# Patient Record
Sex: Male | Born: 1950 | Race: White | Hispanic: No | Marital: Married
Health system: Midwestern US, Community
[De-identification: ages and names within clinical notes are randomized; demographics above are authoritative.]

## PROBLEM LIST (undated history)

## (undated) DIAGNOSIS — E7800 Pure hypercholesterolemia, unspecified: Secondary | ICD-10-CM

## (undated) DIAGNOSIS — I10 Essential (primary) hypertension: Secondary | ICD-10-CM

## (undated) DIAGNOSIS — G5602 Carpal tunnel syndrome, left upper limb: Secondary | ICD-10-CM

## (undated) DIAGNOSIS — M75112 Incomplete rotator cuff tear or rupture of left shoulder, not specified as traumatic: Secondary | ICD-10-CM

## (undated) DIAGNOSIS — M1711 Unilateral primary osteoarthritis, right knee: Principal | ICD-10-CM

## (undated) DIAGNOSIS — M7542 Impingement syndrome of left shoulder: Secondary | ICD-10-CM

## (undated) HISTORY — PX: HERNIA REPAIR: SHX51

---

## 2011-05-02 NOTE — Anesthesia Post-Procedure Evaluation (Signed)
Formatting of this note might be different from the original.  Post Anesthesia Note    [name]  is recovering from his anesthesia.     His most recent vital signs are: Temp: 36.2 C (97.2 F), Heart Rate: 73 , Pulse: 48 , Resp: 16 , BP: 139/82 mmHg, BP Mean: 101 MM HG, SpO2: 99 %     His airway is patent.  He  is awake and  can follow commands after his anesthetic.  His pain is adequately controlled.  His  vital signs indicate adequate postoperative hydration.  PONV is not clinically significant.    Jodene NamElizabeth R Foxx, MD M.D.   Electronically signed by Consuello BossierFoxx, Elizabeth C at 05/02/2011  8:05 PM EST

## 2011-05-02 NOTE — Anesthesia Pre-Procedure Evaluation (Signed)
Formatting of this note is different from the original.  05/02/2011     Preanesthesia  Consult     [name] is a 60 y.o. male.     Procedure Summary    Date/Time: 05/02/11 1020    Procedure: LAPAROSCOPIC BILATERAL INQUINAL HERNIA REPAIR (Bilateral Inguinal)    Anesthesia Type: General Anesthesia    Diagnosis: Bilat ing hernia [550.92]    Freetext Diagnosis: Bilateral Inguinal Hernia    Surgeon: Harrison Mons, MD       Plan:  GA (includes TIVA)    ASA Physical Status Classification:  PS-2 Mild systemic disease that results in no functional limitations.    Airway:  Mallampati I.    Was the patient on a beta-blocker prior to the admission? No.    Anesthesia Pre-Operative Note    Personal and Family Anesthesia History:  No anesthetic complications.    NPO Status:  Confirmed NPO.    Outpatient prescriptions marked as taking for the 05/02/11 encounter [facility] Encounter) with LILLY, MICHAEL C   Medication Sig Dispense Refill   ? naproxen (ALEVE) 220 mg PO TABS Take 220 mg by Mouth Take As Needed.       ? FLUoxetine (PROZAC) 20 mg PO CAPS Take 20 mg by Mouth Once a Day.         Allergies:  No Known Allergies    Past Medical History   Diagnosis Date   ? Sleep apnea      uses c-pap;study done 2009/[facility]   ? Chronic anxiety      previously treated with zoloft/now on prozac   ? Lung nodule      noted on CT 11/2010; repeatCT 03/2011---"NO CHANGE"   ? Elevated BP      on off in past; no meds   ? Other and unspecified disc disorder of unspecified region      Intervertebral disc disorders--lumbar hernated disc/has had ESI in past--source of pain on off   ? Diverticulosis of colon (without mention of hemorrhage)      Diverticulosis--11/2010 diverticulitis--TREATED WITH ANTIBIOTICS   ? Knee pain      bil    ? Cough 04/2011     took z-pak/cough is resolving     Past Surgical History   Procedure Date   ? Hx orthopedic surgery      x4--ACL twice on ea leg   ? Hx hernia repair      x2--right 1980 s; 1990's   ? Hx wisdom teeth  extraction late age 73's     Family History   Problem Relation Age of Onset   ? Hypertension Mother    ? Alzheimer's Mother    ? Cancer Sister      skin   ? Heart Failure Father      HA     History   Social History   ? Marital Status: Married     Spouse Name: N/A     Number of Children: N/A   ? Years of Education: N/A   Occupational History   ? Not on file.   Social History Main Topics   ? Smoking status: Former Smoker -- 1.0 packs/day for 10 years     Types: Cigarettes     Quit date: 05/06/1972   ? Smokeless tobacco: Never Used   ? Alcohol Use: Yes      occ   ? Drug Use: No   ? Sexually Active: Not on file   Other Topics Concern   ?  Blood Transfusions No   ? Exercise No     can climb stairs--no sob or c.p.   ? Special Diet No   ? Weight Concern Yes   Social History Narrative   ? No narrative on file     Review of Systems:  Review of systems per above medical, surgical and social history.    Dentition:  Normal and with left lower cracked molar.    Physical Exam:  BP 137/79  Pulse 69  Temp 36.7 C (98 F)  Resp 18  Ht 6\' 2"  (1.88 m)  Wt 115.667 kg (255 lb)  BMI 32.74 kg/m2  SpO2 97%  Heart:  Regular rate/rhythm.  Lungs:  Clear to auscultation.  Other physical exam comments:   EKG: No EKG.    CXR:  No CXR.    Laboratory Studies:   Lab Results   Component Value Date    NA 137 11/24/2010    NA 135* 11/24/2010    POTASSIUM 4.1 11/24/2010    POTASSIUM 5.2 11/24/2010    CREAT 0.7 11/24/2010    CREAT 0.6 11/24/2010    GLUCOSE 82 11/24/2010    GLUCOSE 85 11/24/2010    HCT 45.0 11/24/2010    HCT 45.2 11/24/2010    PLATELET 203 11/24/2010     Other Studies:    I have informed the patient or their guardian of the nature and purpose of the type of anesthesia, the reasonable alternative anesthesia methods, pertinent foreseeable risks involved and the possibility of complications.  I have explained that an alternative form of anesthesia may be required by unexpected conditions arising before or during the procedure.  Questions have  been answered to the satisfaction of the patient or their guardian who accepts the risk and agrees to proceed as planned.    Ronalee BeltsSatish P Adawadkar, MD      Electronically signed by Ronalee BeltsAdawadkar, Satish P, MD at 05/02/2011 10:49 AM EST

## 2015-02-03 LAB — PSA, DIAGNOSTIC (PROSTATE SPECIFIC AG): Prostate Specific Ag: 0.678 ng/ml (ref 0.0–4.1)

## 2015-09-01 LAB — TESTOSTERONE, TOTAL, ADULT MALE: Prostate Specific Ag: 566 ng/dl (ref 348–1197)

## 2016-06-21 NOTE — Anesthesia Pre-Procedure Evaluation (Signed)
Formatting of this note is different from the original.   Anesthesia Evaluation     Patient summary reviewed and Nursing notes reviewed    No history of anesthetic complications   Airway   Mallampati: II    Dental        Pulmonary - normal exam   breath sounds clear to auscultation  (+) sleep apnea on CPAP,   Cardiovascular   (+)   hypertension, well controlled    Rhythm: regular  Rate: normal    Neuro/Psych   (+) headaches,     GI/Hepatic/Renal     Comments: H/o polyps   Endo/Other           Anesthesia Plan    ASA 3     general     intravenous induction   Postoperative administration of opioids is intended.  Anesthetic plan and risks discussed with patient.  Use of blood products discussed with patient whom.   Plan discussed with CRNA.    QI NUMBER: [id number]    Electronically signed by Verlan Friends, MD at 06/21/2016 11:13 AM EST

## 2016-06-21 NOTE — Anesthesia Post-Procedure Evaluation (Signed)
Formatting of this note is different from the original.  Patient: Michael Galvan    Procedure(s):  COLONOSCOPY  //  Make 1130 case    Procedure Summary     Date Anesthesia Start Anesthesia Stop Room / Location    06/21/16 1120 1140 [facility] PROCEDURE RM 1 / [facility] OR      Procedure Diagnosis Surgeon Responsible Provider    COLONOSCOPY  //  Make 1130 case (N/A ) History of adenomatous polyp of colon  (History of adenomatous polyp of colon [Z86.010]) Nyra Capes, MD Verlan Friends, MD       Anesthesia Type: general  Last vitals  BP   125/80 (06/21/16 1157)    Temp     Pulse   62 (06/21/16 1158)   Resp   (!) 14 (06/21/16 1158)    SpO2   96 % (06/21/16 1158)      Patient location during evaluation: PACU  Patient participation: complete - patient participated  Level of consciousness: awake and alert  Pain score: 1  Pain management: satisfactory to patient  Airway patency: patent  no PONV   Other Anesthetic complications: no  Cardiovascular status: hemodynamically stable  Respiratory status: room air  Hydration status: euvolemic  Regional Anesthetic used: No      Electronically signed by Verlan Friends, MD at 06/21/2016 12:06 PM EST

## 2016-09-27 LAB — PSA, DIAGNOSTIC (PROSTATE SPECIFIC AG): Prostate Specific Ag: 0.991 ng/ml (ref 0.0–4.1)

## 2018-06-05 LAB — HEMOGLOBIN A1C
Estimated Avg Glucose, External: 137 mg/dL — ABNORMAL HIGH (ref 91–123)
Hemoglobin A1C, External: 6.4 % — ABNORMAL HIGH (ref 4.8–5.6)

## 2019-03-29 NOTE — Progress Notes (Signed)
Formatting of this note might be different from the original.  Negative COVID19 result received.  Patient notified via informed in ED   Electronically signed by Ceasar Lund, MA at 03/29/2019 12:42 PM EST

## 2019-09-21 ENCOUNTER — Ambulatory Visit
Admission: EM | Admit: 2019-09-21 | Discharge: 2019-09-21 | Disposition: A | Discharge status: Home / Self Care | Payer: Medicare Other

## 2019-09-21 DIAGNOSIS — J069 Acute upper respiratory infection, unspecified: Secondary | ICD-10-CM | POA: Diagnosis not present

## 2019-09-21 HISTORY — DX: Pure hypercholesterolemia, unspecified: E78.00

## 2019-09-21 HISTORY — DX: Essential (primary) hypertension: I10

## 2019-09-21 MED ORDER — CETIRIZINE HCL 10 MG PO TABS
10.0000 mg | ORAL_TABLET | Freq: Every day | ORAL | 0 refills | Status: DC
Start: 2019-09-21 — End: 2019-09-21

## 2019-09-21 MED ORDER — ALBUTEROL SULFATE HFA 108 (90 BASE) MCG/ACT IN AERS: 2.0000 | INHALATION_SPRAY | RESPIRATORY_TRACT | 0 refills | Status: AC | PRN

## 2019-09-21 MED ORDER — BENZONATATE 100 MG PO CAPS
100.0000 mg | ORAL_CAPSULE | Freq: Three times a day (TID) | ORAL | 0 refills | Status: DC
Start: 2019-09-21 — End: 2019-09-21

## 2019-09-21 MED ORDER — CETIRIZINE HCL 10 MG PO TABS
10.0000 mg | ORAL_TABLET | Freq: Every day | ORAL | 0 refills | Status: DC
Start: 2019-09-21 — End: 2019-12-17

## 2019-09-21 MED ORDER — PREDNISONE 20 MG PO TABS
20.0000 mg | ORAL_TABLET | Freq: Every day | ORAL | 0 refills | Status: DC
Start: 2019-09-21 — End: 2019-09-21

## 2019-09-21 MED ORDER — AEROCHAMBER PLUS FLO-VU MEDIUM MISC
1.0000 | Freq: Once | 0 refills | Status: AC
Start: 2019-09-21 — End: 2019-09-21

## 2019-09-21 MED ORDER — FLUTICASONE PROPIONATE 50 MCG/ACT NA SUSP
1.0000 | Freq: Every day | NASAL | 0 refills | Status: DC
Start: 2019-09-21 — End: 2019-09-21

## 2019-09-21 MED ORDER — ALBUTEROL SULFATE HFA 108 (90 BASE) MCG/ACT IN AERS
2.0000 | INHALATION_SPRAY | RESPIRATORY_TRACT | 0 refills | Status: DC | PRN
Start: 2019-09-21 — End: 2019-09-21

## 2019-09-21 MED ORDER — BENZONATATE 100 MG PO CAPS
100.0000 mg | ORAL_CAPSULE | Freq: Three times a day (TID) | ORAL | 0 refills | Status: DC
Start: 2019-09-21 — End: 2019-12-17

## 2019-09-21 MED ORDER — AEROCHAMBER PLUS FLO-VU MEDIUM MISC
1.0000 | Freq: Once | 0 refills | Status: DC
Start: 2019-09-21 — End: 2019-09-21

## 2019-09-21 MED ORDER — PREDNISONE 20 MG PO TABS
20.0000 mg | ORAL_TABLET | Freq: Every day | ORAL | 0 refills | Status: DC
Start: 2019-09-21 — End: 2019-12-17

## 2019-09-21 MED ORDER — FLUTICASONE PROPIONATE 50 MCG/ACT NA SUSP
1.0000 | Freq: Every day | NASAL | 0 refills | Status: AC
Start: 2019-09-21 — End: ?

## 2019-09-21 NOTE — ED Triage Notes (Signed)
Pt c/o sore throat, cough, nasal congestion, and chest congestion. State worse at night. Pt states has had both covid vaccine back in march.

## 2019-09-21 NOTE — ED Provider Notes (Signed)
[facility]    CSN: [id number] Arrival date & time: 09/21/19  1715      History   Chief Complaint Chief Complaint  Patient presents with  . Cough    HPI Donald Richard is a 69 y.o. male.  With history of hypertension presenting for sore throat, cough, nasal congestion since Sunday.  Denies known sick contacts, fever, arthralgias, myalgias.  Reports good oral intake without change in bowel or bladder habit.  Received second dose of Pfizer Covid vaccine end of March.  Has used OTC medications without relief.   Past Medical History:  Diagnosis Date  . High cholesterol   . Hypertension     There are no problems to display for this patient.   Past Surgical History:  Procedure Laterality Date  . HERNIA REPAIR         Home Medications    Prior to Admission medications   Medication Sig Start Date End Date Taking? Authorizing Provider  amphetamine-dextroamphetamine (ADDERALL) 5 MG tablet Take 5 mg by mouth daily.   Yes [provider]  atorvastatin (LIPITOR) 20 MG tablet Take 20 mg by mouth daily.   Yes [provider]  FLUoxetine HCl (PROZAC PO) Take by mouth.   Yes [provider]  irbesartan (AVAPRO) 150 MG tablet Take 150 mg by mouth daily.   Yes [provider]  albuterol (VENTOLIN HFA) 108 (90 Base) MCG/ACT inhaler Inhale 2 puffs into the lungs every 4 (four) hours as needed for wheezing or shortness of breath. 09/21/19   Hall-Potvin, [place], PA-C  benzonatate (TESSALON) 100 MG capsule Take 1 capsule (100 mg total) by mouth every 8 (eight) hours. 09/21/19   Hall-Potvin, [place], PA-C  cetirizine (ZYRTEC ALLERGY) 10 MG tablet Take 1 tablet (10 mg total) by mouth daily. 09/21/19   Hall-Potvin, [place], PA-C  fluticasone (FLONASE) 50 MCG/ACT nasal spray Place 1 spray into both nostrils daily. 09/21/19   Hall-Potvin, [place], PA-C  predniSONE (DELTASONE) 20 MG tablet Take 1 tablet (20 mg total) by mouth daily. 09/21/19    Hall-Potvin, [place], PA-C    Family History History reviewed. No pertinent family history.  Social History Social History   Tobacco Use  . Smoking status: Never Smoker  . Smokeless tobacco: Never Used  Substance Use Topics  . Alcohol use: Yes    Comment: social  . Drug use: Never     Allergies   Patient has no known allergies.   Review of Systems As per HPI   Physical Exam Triage Vital Signs ED Triage Vitals  Enc Vitals Group     BP 09/21/19 1735 129/80     Pulse Rate 09/21/19 1735 90     Resp 09/21/19 1735 18     Temp 09/21/19 1735 98.9 F (37.2 C)     Temp Source 09/21/19 1735 Oral     SpO2 09/21/19 1735 95 %     Weight --      Height --      Head Circumference --      Peak Flow --      Pain Score 09/21/19 1742 0     Pain Loc --      Pain Edu? --      Excl. in [place]? --    No data found.  Updated Vital Signs BP 129/80 (BP Location: Left Arm)   Pulse 90   Temp 98.9 F (37.2 C) (Oral)   Resp 18   SpO2 95%   Visual Acuity Right Eye  Distance:   Left Eye Distance:   Bilateral Distance:    Right Eye Near:   Left Eye Near:    Bilateral Near:     Physical Exam Constitutional:      General: He is not in acute distress.    Appearance: He is not toxic-appearing or diaphoretic.  HENT:     Head: Normocephalic and atraumatic.     Mouth/Throat:     Mouth: Mucous membranes are moist.     Pharynx: Oropharynx is clear.  Eyes:     General: No scleral icterus.    Conjunctiva/sclera: Conjunctivae normal.     Pupils: Pupils are equal, round, and reactive to light.  Neck:     Comments: Trachea midline, negative JVD Cardiovascular:     Rate and Rhythm: Normal rate and regular rhythm.  Pulmonary:     Effort: Pulmonary effort is normal. No respiratory distress.     Breath sounds: No wheezing.  Musculoskeletal:     Cervical back: Neck supple. No tenderness.  Lymphadenopathy:     Cervical: No cervical adenopathy.  Skin:    Capillary Refill: Capillary  refill takes less than 2 seconds.     Coloration: Skin is not jaundiced or pale.     Findings: No rash.  Neurological:     Mental Status: He is alert and oriented to person, place, and time.      UC Treatments / Results  Labs (all labs ordered are listed, but only abnormal results are displayed) Labs Reviewed  NOVEL CORONAVIRUS, NAA    EKG   Radiology No results found.  Procedures Procedures (including critical care time)  Medications Ordered in UC Medications - No data to display  Initial Impression / Assessment and Plan / UC Course  I have reviewed the triage vital signs and the nursing notes.  Pertinent labs & imaging results that were available during my care of the patient were reviewed by me and considered in my medical decision making (see chart for details).     Patient afebrile, nontoxic, with SpO2 95%.  Covid PCR pending.  Patient to quarantine until results are back.  We will treat supportively as outlined below.  Return precautions discussed, patient verbalized understanding and is agreeable to plan. Final Clinical Impressions(s) / UC Diagnoses   Final diagnoses:  URI with cough and congestion     Discharge Instructions     Your COVID test is pending - it is important to quarantine / isolate at home until your results are back. If you test positive and would like further evaluation for persistent or worsening symptoms, you may schedule an E-visit or virtual (video) visit throughout the [facility] app or website.  PLEASE NOTE: If you develop severe chest pain or shortness of breath please go to the ER or call 9-1-1 for further evaluation --> DO NOT schedule electronic or virtual visits for this. Please call our office for further guidance / recommendations as needed.  For information about the Covid vaccine, please visit [email or web address]    ED Prescriptions    Medication Sig Dispense Auth. Provider   benzonatate (TESSALON) 100 MG  capsule  (Status: Discontinued) Take 1 capsule (100 mg total) by mouth every 8 (eight) hours. 21 capsule Hall-Potvin, [place], PA-C   predniSONE (DELTASONE) 20 MG tablet  (Status: Discontinued) Take 1 tablet (20 mg total) by mouth daily. 5 tablet Hall-Potvin, [place], PA-C   fluticasone (FLONASE) 50 MCG/ACT nasal spray  (Status: Discontinued) Place 1 spray into both nostrils daily.  16 g Hall-Potvin, [place], PA-C   albuterol (VENTOLIN HFA) 108 (90 Base) MCG/ACT inhaler  (Status: Discontinued) Inhale 2 puffs into the lungs every 4 (four) hours as needed for wheezing or shortness of breath. 18 g Hall-Potvin, [place], PA-C   Spacer/Aero-Holding Chambers (AEROCHAMBER PLUS FLO-VU MEDIUM) MISC  (Status: Discontinued) 1 each by Other route once for 1 dose. 1 each Hall-Potvin, [place], PA-C   cetirizine (ZYRTEC ALLERGY) 10 MG tablet  (Status: Discontinued) Take 1 tablet (10 mg total) by mouth daily. 30 tablet Hall-Potvin, [place], PA-C   albuterol (VENTOLIN HFA) 108 (90 Base) MCG/ACT inhaler Inhale 2 puffs into the lungs every 4 (four) hours as needed for wheezing or shortness of breath. 18 g Hall-Potvin, [place], PA-C   benzonatate (TESSALON) 100 MG capsule Take 1 capsule (100 mg total) by mouth every 8 (eight) hours. 21 capsule Hall-Potvin, [place], PA-C   cetirizine (ZYRTEC ALLERGY) 10 MG tablet Take 1 tablet (10 mg total) by mouth daily. 30 tablet Hall-Potvin, [place], PA-C   fluticasone (FLONASE) 50 MCG/ACT nasal spray Place 1 spray into both nostrils daily. 16 g Hall-Potvin, [place], PA-C   predniSONE (DELTASONE) 20 MG tablet Take 1 tablet (20 mg total) by mouth daily. 5 tablet Hall-Potvin, [place], PA-C   Spacer/Aero-Holding Chambers (AEROCHAMBER PLUS FLO-VU MEDIUM) MISC 1 each by Other route once for 1 dose. 1 each Hall-Potvin, [place], PA-C     PDMP not reviewed this encounter.   Hall-Potvin, [place], [place] 09/22/19 1233

## 2019-09-21 NOTE — Discharge Instructions (Signed)
Your COVID test is pending - it is important to quarantine / isolate at home until your results are back. °If you test positive and would like further evaluation for persistent or worsening symptoms, you may schedule an E-visit or virtual (video) visit throughout the [facility] app or website. ° °PLEASE NOTE: If you develop severe chest pain or shortness of breath please go to the ER or call 9-1-1 for further evaluation --> DO NOT schedule electronic or virtual visits for this. °Please call our office for further guidance / recommendations as needed. ° °For information about the Covid vaccine, please visit [email or web address] °

## 2019-09-22 ENCOUNTER — Encounter: Payer: Self-pay | Admitting: Emergency Medicine

## 2019-09-23 LAB — NOVEL CORONAVIRUS, NAA: SARS-CoV-2, NAA: NOT DETECTED

## 2019-09-23 LAB — SARS-COV-2, NAA 2 DAY TAT

## 2019-12-17 ENCOUNTER — Other Ambulatory Visit: Payer: Self-pay

## 2019-12-17 ENCOUNTER — Ambulatory Visit
Admission: EM | Admit: 2019-12-17 | Discharge: 2019-12-17 | Disposition: A | Discharge status: Home / Self Care | Payer: Medicare Other

## 2019-12-17 DIAGNOSIS — J069 Acute upper respiratory infection, unspecified: Secondary | ICD-10-CM | POA: Diagnosis not present

## 2019-12-17 DIAGNOSIS — R059 Cough, unspecified: Secondary | ICD-10-CM

## 2019-12-17 DIAGNOSIS — R05 Cough: Secondary | ICD-10-CM

## 2019-12-17 MED ORDER — BENZONATATE 100 MG PO CAPS
100.0000 mg | ORAL_CAPSULE | Freq: Three times a day (TID) | ORAL | 0 refills | Status: DC
Start: 2019-12-17 — End: 2021-04-17

## 2019-12-17 MED ORDER — AEROCHAMBER PLUS FLO-VU MEDIUM MISC
1.0000 | Freq: Once | 0 refills | Status: AC
Start: 2019-12-17 — End: 2019-12-17

## 2019-12-17 MED ORDER — CETIRIZINE HCL 10 MG PO TABS
10.0000 mg | ORAL_TABLET | Freq: Every day | ORAL | 0 refills | Status: DC
Start: 2019-12-17 — End: 2021-04-17

## 2019-12-17 NOTE — ED Triage Notes (Signed)
Pt c/o cough, congestion and chest soreness x 3 days. Requesting COVID testing. Fully vaccinated over 1 month ago

## 2019-12-17 NOTE — ED Provider Notes (Signed)
[facility]    CSN: [id number] Arrival date & time: 12/17/19  1035      History   Chief Complaint Chief Complaint  Patient presents with  . Cough  . Nasal Congestion    HPI Donald Richard is a 69 y.o. male presenting for URI symptoms x3 days.  Patient birth history: Endorsing mildly productive cough without chest pain, palpitations, shortness of breath.  Endorsing nasal congestion without sinus pressure.  Requesting Covid testing: Fully vaccinated, no known sick contacts.  No fever, vomiting, and is able to have adequate oral intake.    Past Medical History:  Diagnosis Date  . High cholesterol   . Hypertension     There are no problems to display for this patient.   Past Surgical History:  Procedure Laterality Date  . HERNIA REPAIR         Home Medications    Prior to Admission medications   Medication Sig Start Date End Date Taking? Authorizing Provider  Ascorbic Acid (VITAMIN C PO) Take by mouth.   Yes [provider]  Multiple Vitamins-Minerals (ZINC PO) Take by mouth.   Yes [provider]  albuterol (VENTOLIN HFA) 108 (90 Base) MCG/ACT inhaler Inhale 2 puffs into the lungs every 4 (four) hours as needed for wheezing or shortness of breath. 09/21/19   Hall-Potvin, [place], PA-C  amphetamine-dextroamphetamine (ADDERALL) 5 MG tablet Take 5 mg by mouth daily.    [provider]  atorvastatin (LIPITOR) 20 MG tablet Take 20 mg by mouth daily.    [provider]  benzonatate (TESSALON) 100 MG capsule Take 1 capsule (100 mg total) by mouth every 8 (eight) hours. 12/17/19   Hall-Potvin, [place], PA-C  cetirizine (ZYRTEC ALLERGY) 10 MG tablet Take 1 tablet (10 mg total) by mouth daily. 12/17/19   Hall-Potvin, [place], PA-C  FLUoxetine HCl (PROZAC PO) Take by mouth.    [provider]  fluticasone (FLONASE) 50 MCG/ACT nasal spray Place 1 spray into both nostrils daily. 09/21/19   Hall-Potvin, [place], PA-C    irbesartan (AVAPRO) 150 MG tablet Take 150 mg by mouth daily.    [provider]  Spacer/Aero-Holding Chambers (AEROCHAMBER PLUS FLO-VU MEDIUM) MISC 1 each by Other route once for 1 dose. 12/17/19 12/17/19  Hall-Potvin, [place], PA-C    Family History History reviewed. No pertinent family history.  Social History Social History   Tobacco Use  . Smoking status: Never Smoker  . Smokeless tobacco: Never Used  Substance Use Topics  . Alcohol use: Yes    Comment: social  . Drug use: Never     Allergies   Patient has no known allergies.   Review of Systems As per HPI   Physical Exam Triage Vital Signs ED Triage Vitals [12/17/19 1041]  Enc Vitals Group     BP 138/85     Pulse Rate 82     Resp 16     Temp 98.2 F (36.8 C)     Temp src      SpO2 95 %     Weight      Height      Head Circumference      Peak Flow      Pain Score 0     Pain Loc      Pain Edu?      Excl. in [place]?    No data found.  Updated Vital Signs BP 138/85   Pulse 82   Temp 98.2 F (36.8 C)   Resp  16   SpO2 95%   Visual Acuity Right Eye Distance:   Left Eye Distance:   Bilateral Distance:    Right Eye Near:   Left Eye Near:    Bilateral Near:     Physical Exam Constitutional:      General: He is not in acute distress.    Appearance: He is not toxic-appearing or diaphoretic.  HENT:     Head: Normocephalic and atraumatic.     Right Ear: Tympanic membrane, ear canal and external ear normal.     Left Ear: Tympanic membrane, ear canal and external ear normal.     Nose: Nose normal.     Mouth/Throat:     Mouth: Mucous membranes are moist.     Pharynx: Oropharynx is clear. No oropharyngeal exudate or posterior oropharyngeal erythema.  Eyes:     General: No scleral icterus.    Conjunctiva/sclera: Conjunctivae normal.     Pupils: Pupils are equal, round, and reactive to light.  Neck:     Comments: Trachea midline, negative JVD Cardiovascular:     Rate and Rhythm: Normal  rate and regular rhythm.  Pulmonary:     Effort: Pulmonary effort is normal. No respiratory distress.     Breath sounds: No wheezing.  Musculoskeletal:     Cervical back: Neck supple. No tenderness.  Lymphadenopathy:     Cervical: No cervical adenopathy.  Skin:    Capillary Refill: Capillary refill takes less than 2 seconds.     Coloration: Skin is not jaundiced or pale.     Findings: No rash.  Neurological:     Mental Status: He is alert and oriented to person, place, and time.      UC Treatments / Results  Labs (all labs ordered are listed, but only abnormal results are displayed) Labs Reviewed  NOVEL CORONAVIRUS, NAA    EKG   Radiology No results found.  Procedures Procedures (including critical care time)  Medications Ordered in UC Medications - No data to display  Initial Impression / Assessment and Plan / UC Course  I have reviewed the triage vital signs and the nursing notes.  Pertinent labs & imaging results that were available during my care of the patient were reviewed by me and considered in my medical decision making (see chart for details).     Patient afebrile, nontoxic, with SpO2 95%.  Covid PCR pending.  Patient to quarantine until results are back.  We will treat supportively as outlined below.  Return precautions discussed, patient verbalized understanding and is agreeable to plan. Final Clinical Impressions(s) / UC Diagnoses   Final diagnoses:  Cough  URI with cough and congestion     Discharge Instructions     Tessalon for cough. Start flonase, atrovent nasal spray for nasal congestion/drainage. You can use over the counter nasal saline rinse such as neti pot for nasal congestion. Keep hydrated, your urine should be clear to pale yellow in color. Tylenol/motrin for fever and pain. Monitor for any worsening of symptoms, chest pain, shortness of breath, wheezing, swelling of the throat, go to the emergency department for further evaluation  needed.     ED Prescriptions    Medication Sig Dispense Auth. Provider   cetirizine (ZYRTEC ALLERGY) 10 MG tablet Take 1 tablet (10 mg total) by mouth daily. 30 tablet Hall-Potvin, [place], PA-C   Spacer/Aero-Holding Chambers (AEROCHAMBER PLUS FLO-VU MEDIUM) MISC 1 each by Other route once for 1 dose. 1 each Hall-Potvin, [place], PA-C   benzonatate (TESSALON) 100  MG capsule Take 1 capsule (100 mg total) by mouth every 8 (eight) hours. 21 capsule Hall-Potvin, [place], PA-C     PDMP not reviewed this encounter.   Hall-Potvin, [place], [place] 12/17/19 1146

## 2019-12-17 NOTE — Discharge Instructions (Signed)

## 2019-12-18 LAB — NOVEL CORONAVIRUS, NAA: SARS-CoV-2, NAA: NOT DETECTED

## 2019-12-18 LAB — SARS-COV-2, NAA 2 DAY TAT

## 2020-02-25 ENCOUNTER — Ambulatory Visit (HOSPITAL_COMMUNITY)
Admission: EM | Admit: 2020-02-25 | Discharge: 2020-02-25 | Disposition: A | Discharge status: Home / Self Care | Payer: Medicare Other | Attending: Urgent Care | Admitting: Urgent Care

## 2020-02-25 ENCOUNTER — Encounter (HOSPITAL_COMMUNITY): Payer: Self-pay

## 2020-02-25 ENCOUNTER — Other Ambulatory Visit: Payer: Self-pay

## 2020-02-25 DIAGNOSIS — R5383 Other fatigue: Secondary | ICD-10-CM | POA: Insufficient documentation

## 2020-02-25 DIAGNOSIS — N3001 Acute cystitis with hematuria: Secondary | ICD-10-CM

## 2020-02-25 DIAGNOSIS — R52 Pain, unspecified: Secondary | ICD-10-CM

## 2020-02-25 DIAGNOSIS — Z79899 Other long term (current) drug therapy: Secondary | ICD-10-CM | POA: Diagnosis not present

## 2020-02-25 DIAGNOSIS — M791 Myalgia, unspecified site: Secondary | ICD-10-CM

## 2020-02-25 DIAGNOSIS — Z20822 Contact with and (suspected) exposure to covid-19: Secondary | ICD-10-CM | POA: Diagnosis not present

## 2020-02-25 DIAGNOSIS — R509 Fever, unspecified: Secondary | ICD-10-CM | POA: Diagnosis not present

## 2020-02-25 DIAGNOSIS — R5381 Other malaise: Secondary | ICD-10-CM | POA: Diagnosis present

## 2020-02-25 LAB — POCT URINALYSIS DIPSTICK, ED / UC
Bilirubin Urine: NEGATIVE
Glucose, UA: NEGATIVE mg/dL
Ketones, ur: NEGATIVE mg/dL
Nitrite: POSITIVE — AB
Protein, ur: 30 mg/dL — AB
Specific Gravity, Urine: 1.02 (ref 1.005–1.030)
Urobilinogen, UA: 1 mg/dL (ref 0.0–1.0)
pH: 6 (ref 5.0–8.0)

## 2020-02-25 LAB — RESP PANEL BY RT PCR (RSV, FLU A&B, COVID)
Influenza A by PCR: NEGATIVE
Influenza B by PCR: NEGATIVE
Respiratory Syncytial Virus by PCR: NEGATIVE
SARS Coronavirus 2 by RT PCR: NEGATIVE

## 2020-02-25 MED ORDER — SULFAMETHOXAZOLE-TRIMETHOPRIM 800-160 MG PO TABS
1.0000 | ORAL_TABLET | Freq: Two times a day (BID) | ORAL | 0 refills | Status: AC
Start: 2020-02-25 — End: ?

## 2020-02-25 NOTE — ED Provider Notes (Signed)
[facility]   MRN: [id number] DOB: 1950/05/11  Subjective:   Donald Richard is a 69 y.o. male presenting for 1 day history of acute onset malaise.  Patient has had COVID vaccination.  ROS as below.  Would also like to have his urine evaluated, states that he has had more urinary frequency, urgency.  States the last time he was checked for diabetes was last year in November, has not gone back for his annual checkup.  No current facility-administered medications for this encounter.  Current Outpatient Medications:    albuterol (VENTOLIN HFA) 108 (90 Base) MCG/ACT inhaler, Inhale 2 puffs into the lungs every 4 (four) hours as needed for wheezing or shortness of breath., Disp: 18 g, Rfl: 0   amphetamine-dextroamphetamine (ADDERALL) 5 MG tablet, Take 5 mg by mouth daily., Disp: , Rfl:    Ascorbic Acid (VITAMIN C PO), Take by mouth., Disp: , Rfl:    atorvastatin (LIPITOR) 20 MG tablet, Take 20 mg by mouth daily., Disp: , Rfl:    benzonatate (TESSALON) 100 MG capsule, Take 1 capsule (100 mg total) by mouth every 8 (eight) hours., Disp: 21 capsule, Rfl: 0   cetirizine (ZYRTEC ALLERGY) 10 MG tablet, Take 1 tablet (10 mg total) by mouth daily., Disp: 30 tablet, Rfl: 0   FLUoxetine HCl (PROZAC PO), Take by mouth., Disp: , Rfl:    fluticasone (FLONASE) 50 MCG/ACT nasal spray, Place 1 spray into both nostrils daily., Disp: 16 g, Rfl: 0   irbesartan (AVAPRO) 150 MG tablet, Take 150 mg by mouth daily., Disp: , Rfl:    Multiple Vitamins-Minerals (ZINC PO), Take by mouth., Disp: , Rfl:    No Known Allergies  Past Medical History:  Diagnosis Date   High cholesterol    Hypertension      Past Surgical History:  Procedure Laterality Date   HERNIA REPAIR      History reviewed. No pertinent family history.  Social History   Tobacco Use   Smoking status: Never Smoker   Smokeless tobacco: Never Used  Substance Use Topics   Alcohol use: Yes    Comment: social    Drug use: Never    Review of Systems  Constitutional: Positive for fever and malaise/fatigue.  HENT: Negative for congestion, ear pain, sinus pain and sore throat.   Eyes: Negative for discharge and redness.  Respiratory: Positive for shortness of breath. Negative for cough, hemoptysis and wheezing.   Cardiovascular: Negative for chest pain.  Gastrointestinal: Negative for abdominal pain, blood in stool, constipation, diarrhea, nausea and vomiting.  Genitourinary: Positive for frequency. Negative for dysuria, flank pain, hematuria and urgency.  Musculoskeletal: Positive for myalgias.  Skin: Negative for rash.  Neurological: Positive for headaches. Negative for dizziness and weakness.  Psychiatric/Behavioral: Negative for depression and substance abuse.    Objective:   Vitals: BP 130/75 (BP Location: Right Arm)    Pulse 88    Temp 98.6 F (37 C) (Oral)    Resp 18    SpO2 100%   Physical Exam Constitutional:      General: He is not in acute distress.    Appearance: Normal appearance. He is well-developed. He is not ill-appearing, toxic-appearing or diaphoretic.  HENT:     Head: Normocephalic and atraumatic.     Right Ear: External ear normal.     Left Ear: External ear normal.     Nose: Nose normal.     Mouth/Throat:     Mouth: Mucous membranes are moist.  Pharynx: Oropharynx is clear.  Eyes:     General: No scleral icterus.       Right eye: No discharge.        Left eye: No discharge.     Extraocular Movements: Extraocular movements intact.     Conjunctiva/sclera: Conjunctivae normal.     Pupils: Pupils are equal, round, and reactive to light.  Cardiovascular:     Rate and Rhythm: Normal rate and regular rhythm.     Heart sounds: Normal heart sounds. No murmur heard.  No friction rub. No gallop.   Pulmonary:     Effort: Pulmonary effort is normal. No respiratory distress.     Breath sounds: Normal breath sounds. No stridor. No wheezing, rhonchi or rales.  Abdominal:      Tenderness: There is no right CVA tenderness or left CVA tenderness.  Neurological:     Mental Status: He is alert and oriented to person, place, and time.  Psychiatric:        Mood and Affect: Mood normal.        Behavior: Behavior normal.        Thought Content: Thought content normal.        Judgment: Judgment normal.     Results for orders placed or performed during the hospital encounter of 02/25/20 (from the past 24 hour(s))  POC Urinalysis dipstick     Status: Abnormal   Collection Time: 02/25/20  3:48 PM  Result Value Ref Range   Glucose, UA NEGATIVE NEGATIVE mg/dL   Bilirubin Urine NEGATIVE NEGATIVE   Ketones, ur NEGATIVE NEGATIVE mg/dL   Specific Gravity, Urine 1.020 1.005 - 1.030   Hgb urine dipstick MODERATE (A) NEGATIVE   pH 6.0 5.0 - 8.0   Protein, ur 30 (A) NEGATIVE mg/dL   Urobilinogen, UA 1.0 0.0 - 1.0 mg/dL   Nitrite POSITIVE (A) NEGATIVE   Leukocytes,Ua SMALL (A) NEGATIVE     Assessment and Plan :   PDMP not reviewed this encounter.  1. Acute cystitis with hematuria   2. Body aches   3. Malaise and fatigue     Respiratory panel pending.  Start Bactrim to cover for acute cystitis, urine culture pending.  Recommended aggressive hydration, limiting urinary irritants.  Emphasized need for close follow-up with PCP.  Counseled patient on potential for adverse effects with medications prescribed/recommended today, ER and return-to-clinic precautions discussed, patient verbalized understanding.    Wallis Bamberg, PA-C 02/25/20 1606

## 2020-02-25 NOTE — ED Triage Notes (Signed)
Pt present fever and body aches. Symptoms started yesterday. Pt states he has a slight cough but is not persistent

## 2020-02-28 LAB — URINE CULTURE: Culture: >=100000 — AB

## 2020-03-03 LAB — PSA PROSTATIC SPECIFIC ANTIGEN: PSA: 9 ng/mL — AB (ref 0.0–4.1)

## 2020-03-03 LAB — PSA, DIAGNOSTIC (PROSTATE SPECIFIC AG): Prostate Specific Ag: 9 ng/ml — AB (ref 0.0–4.1)

## 2020-03-04 LAB — HEMOGLOBIN A1C
Estimated Avg Glucose, External: 150 mg/dL — ABNORMAL HIGH (ref 91–123)
Hemoglobin A1C, External: 6.9 % — ABNORMAL HIGH (ref 4.8–5.6)

## 2020-03-17 ENCOUNTER — Ambulatory Visit: Attending: Family | Primary: Internal Medicine

## 2020-03-17 ENCOUNTER — Encounter: Attending: Family | Primary: Internal Medicine

## 2020-03-17 ENCOUNTER — Ambulatory Visit: Admit: 2020-03-17 | Discharge: 2020-03-17 | Attending: Family | Primary: Internal Medicine

## 2020-03-17 DIAGNOSIS — R972 Elevated prostate specific antigen [PSA]: Secondary | ICD-10-CM

## 2020-03-17 LAB — AMB POC URINALYSIS DIP STICK AUTO W/O MICRO
Bilirubin (UA POC): NEGATIVE
Bilirubin, Urine, POC: NEGATIVE
Blood (UA POC): NEGATIVE
Blood (UA POC): NEGATIVE
Glucose (UA POC): NEGATIVE
Glucose, Urine, POC: NEGATIVE
Ketones (UA POC): NEGATIVE
Ketones, Urine, POC: NEGATIVE
Leukocyte Esterase, Urine, POC: NEGATIVE
Leukocyte esterase (UA POC): NEGATIVE
Nitrite, Urine, POC: NEGATIVE
Nitrites (UA POC): NEGATIVE
Protein (UA POC): NEGATIVE
Protein, Urine, POC: NEGATIVE
Specific Gravity, Urine, POC: 1.03 NA (ref 1.001–1.035)
Specific gravity (UA POC): 1.03 (ref 1.001–1.035)
Urobilinogen (UA POC): 0.2 (ref 0.2–1)
Urobilinogen, POC: 0.2 (ref 0.2–1)
pH (UA POC): 5.5 (ref 4.6–8.0)
pH, Urine, POC: 5.5 NA (ref 4.6–8.0)

## 2020-03-17 LAB — AMB POC PVR, MEAS,POST-VOID RES,US,NON-IMAGING
PVR POC: 32 cc
PVR: 32 cc

## 2020-03-17 NOTE — Progress Notes (Signed)
Progress Notes by Monica Becton, FNP at 03/17/20 0920                Author: Monica Becton, FNP  Service: --  Author Type: Nurse Practitioner       Filed: 03/17/20 1253  Encounter Date: 03/17/2020  Status: Signed          Editor: Monica Becton, FNP (Nurse Practitioner)                    Michael Galvan   DOB November 07, 1950   Enc Date 03/17/2020        Chief Complaint       Patient presents with        ?  Elevated PSA           HPI:         Michael Galvan is a 69 y.o.  male who was referred by No primary care provider on file. for evaluation  of elevated PSA.       Patient established care on 03/17/2020. PSA was 9.00 ng/mL on 03/03/2020, previously was 0.991 ng/mL on 09/27/2016. No PSAs in 2019 or 2020 done.FH father w/bladder Ca. No FH of PCa. Non smoker. Only urinary c/o was UUI went he held his urine for too long.  No sx of infection.         PSAs:   12/2010-1.71   12/2011-1.1   12/2012-0.898   01/2015-0.678   09/2016-0.991   02/2020-9.00           Past Medical History:        Diagnosis  Date         ?  Anxiety       ?  Hypertension       ?  OSA (obstructive sleep apnea)       ?  Pure hypercholesterolemia       ?  Sleep apnea           ?  Type 2 diabetes mellitus (HCC)             No past surgical history on file.       No family history on file.      Social Hx: works at a [employer]      Allergies:   No Known Allergies         ROS:      Constitutional: Fever:    Skin: Rash:    HEENT: Hearing difficulty:    Eyes: Blurred vision:    Cardiovascular: Chest pain:    Respiratory: Shortness of breath:    Gastrointestinal: Nausea/vomiting:    Musculoskeletal: Back pain:    Neurological: Weakness:    Psychological: Memory loss:    Comments/additional findings:    All other systems reviewed and are negative            PE:      Visit Vitals      Temp  97.1 ??F (36.2 ??C)     Ht  6' (1.829 m)     Wt  257 lb (116.6 kg)        BMI  34.86 kg/m??        Constitutional: WDWN, Pleasant and appropriate affect, No  acute distress .     CV:  No peripheral swelling noted   Respiratory: No respiratory distress or difficulties   GI:  No abdominal masses or tenderness.    No hernias noted.    Skin:  No jaundice.     Neuro/Psych:  Alert and o riented x 3. Affect  appropriate.    Lymphatic:   No enlarged inguinal lymph nodes.             GU Male: 03/17/2020    Back:  No CVA tenderness.    Scrotum:  No scrotal rash or lesions noted.   Testes:  Normal bilateral testes.    Epididymides: Normal    Meatus: Normal, no discharge    Penis: Normal, no lesions or deformity    Prostate:  Prostate smooth, 2-3/4, asymmetrical L>R  , non-tender, without nodules, with nonpalpable  seminal vesicles.   Sphincter/Rectum: Normal tone, No masses    Anus/Perineum:  N ormal, no lesions.             LAB:        Results for orders placed or performed in visit on 03/17/20     AMB POC URINALYSIS DIP STICK AUTO W/O MICRO         Result  Value  Ref Range            Color (UA POC)  Yellow         Clarity (UA POC)  Clear         Glucose (UA POC)  Negative  Negative       Bilirubin (UA POC)  Negative  Negative       Ketones (UA POC)  Negative  Negative       Specific gravity (UA POC)  1.030  1.001 - 1.035       Blood (UA POC)  Negative  Negative       pH (UA POC)  5.5  4.6 - 8.0       Protein (UA POC)  Negative  Negative       Urobilinogen (UA POC)  0.2 mg/dL  0.2 - 1       Nitrites (UA POC)  Negative  Negative       Leukocyte esterase (UA POC)  Negative  Negative       AMB POC PVR, MEAS,POST-VOID RES,US,NON-IMAGING         Result  Value  Ref Range            PVR  32  cc                 Prostate Specific Ag          Date  Value  Ref Range  Status          03/03/2020  9.0 (A)  0.0 - 4.1 ng/ml  Final     09/27/2016  0.991  0.0 - 4.1 ng/ml  Final     09/01/2015  566  348 - 1,197 ng/dl  Final          16/02/9603  0.678  0.0 - 4.1 ng/ml  Final                  IMP:         Encounter Diagnosis        Name  Primary?         ?  Elevated PSA  Yes        SUMMARY STATEMENT  AND PLAN:      Elevated PSA       Possible lab error   We will recheck in 6 weeks   If value is still elevated above normal limits, patient would like to proceed with  MRI and prostate biopsy.      Return in 6 weeks for discussion      PLEASE NOTE:    This document has been produced using voice recognition software. Unrecognized errors in transcription may be present      Monica Becton, FNP

## 2020-04-22 LAB — PSA, TOTAL AND FREE
PCT FREE PSA,PSA3: 30 % (ref >25)
PSA, FREE,PSA1: 0.43 ng/mL
PSA, TOTAL: 1.43 ng/mL (ref <=4.100)

## 2020-04-22 LAB — PSA, TOTAL &  FREE
% FREE PSA: 30 % (ref >25)
PSA Total: 1.43 ng/mL (ref <=4.100)
PSA, FREE: 0.43 ng/mL

## 2020-04-25 ENCOUNTER — Ambulatory Visit: Attending: Family | Primary: Internal Medicine

## 2020-04-25 ENCOUNTER — Ambulatory Visit: Admit: 2020-04-25 | Discharge: 2020-04-25 | Attending: Family | Primary: Internal Medicine

## 2020-04-25 DIAGNOSIS — R972 Elevated prostate specific antigen [PSA]: Secondary | ICD-10-CM

## 2020-04-25 NOTE — Progress Notes (Signed)
Progress Notes by Monica Becton, FNP at 04/25/20 0940                Author: Monica Becton, FNP  Service: --  Author Type: Nurse Practitioner       Filed: 04/25/20 1005  Encounter Date: 04/25/2020  Status: Signed          Editor: Monica Becton, FNP (Nurse Practitioner)                    Michael Galvan   DOB 03/31/1951   Enc Date 04/25/2020        Chief Complaint       Patient presents with        ?  Elevated PSA           HPI:         Michael Galvan is a 69 y.o.  male who was referred by Evern Core, MD for evaluation of  elevated PSA.       Patient established care on 03/17/2020. PSA was 9.00 ng/mL on 03/03/2020, previously was 0.991 ng/mL on 09/27/2016. No PSAs in 2019 or 2020 done.FH father w/bladder Ca. No FH of PCa. Non smoker. Only urinary c/o was UUI went he held his urine for too long.  No sx of infection.         PSAs:   12/2010-1.71   12/2011-1.1   12/2012-0.898   01/2015-0.678   09/2016-0.991   02/2020-9.00   04/21/20- 1.43      Patient returned 04/25/2020 with a PSA of 1.43 this prior 9.0 PSA was  was most likely an outlier. Pt's normal PSA value is most likely somewhere around 1.5..         Past Medical History:        Diagnosis  Date         ?  Anxiety       ?  Hypertension       ?  OSA (obstructive sleep apnea)       ?  Pure hypercholesterolemia       ?  Sleep apnea           ?  Type 2 diabetes mellitus (HCC)               Past Surgical History:         Procedure  Laterality  Date          ?  HX HERNIA REPAIR         ?  HX HERNIA REPAIR         ?  HX ORTHOPAEDIC              ?  HX WISDOM TEETH EXTRACTION                  Family History         Problem  Relation  Age of Onset          ?  Alzheimer's Disease  Mother       ?  Hypertension  Mother       ?  Heart Failure  Father            ?  Cancer  Sister             Social Hx: works at a [employer]      Allergies:   No Known Allergies         ROS:  Constitutional: Fever: No   Skin: Rash: No   HEENT: Hearing difficulty:  No   Eyes: Blurred vision: No   Cardiovascular: Chest pain: No   Respiratory: Shortness of breath: No   Gastrointestinal: Nausea/vomiting: No   Musculoskeletal: Back pain: No   Neurological: Weakness: No   Psychological: Memory loss: No   Comments/additional findings:    All other systems reviewed and are negative            PE:      Visit Vitals      Resp  16     Ht  6\' 1"  (1.854 m)     Wt  261 lb (118.4 kg)        BMI  34.43 kg/m??        Constitutional: WDWN, Pleasant and appropriate affect, No acute distress .     CV:  No peripheral swelling noted   Respiratory: No respiratory distress or difficulties   GI:  No abdominal masses or tenderness.    No hernias noted.    Skin: No jaundice.     Neuro/Psych:  Alert and o riented x 3. Affect  appropriate.    Lymphatic:   No enlarged inguinal lymph nodes.             GU Male: 03/17/2020    Back:  No CVA tenderness.    Scrotum:  No scrotal rash or lesions noted.   Testes:  Normal bilateral testes.    Epididymides: Normal    Meatus: Normal, no discharge    Penis: Normal, no lesions or deformity    Prostate:  Prostate smooth, 2-3/4, asymmetrical L>R  , non-tender, without nodules, with nonpalpable  seminal vesicles.   Sphincter/Rectum: Normal tone, No masses    Anus/Perineum:  N ormal, no lesions.             LAB:        Results for orders placed or performed in visit on 04/21/20     PSA, TOTAL &  FREE         Result  Value  Ref Range            PSA Total  1.430  <=4.100 ng/mL       PSA, FREE  0.430  ng/mL            % FREE PSA  30  >25 %             PSA /TESTOSTERONE - [facility]  PSA  Free PSA     Latest Ref Rng & Units  0.0 - 4.1 ng/ml  ng/mL         04/21/2020  1.43  0.430         03/03/2020  9.0 (A)           09/27/2016  0.991                IMP:         Encounter Diagnosis        Name  Primary?         ?  Elevated PSA  Yes        SUMMARY STATEMENT AND PLAN:      Elevated PSA       9.0 was a possible lab error or just and outlier       Okay to return to PSAs annually; however  if he wanted to recheck it again in 6 months we could do that also.  Patient would like to follow-up with Dr. Corine Shelter yearly.   Return as needed      PLEASE NOTE:    This document has been produced using voice recognition software. Unrecognized errors in transcription may be present      Monica Becton, FNP

## 2021-01-05 LAB — HEMOGLOBIN A1C
Estimated Avg Glucose, External: 144 mg/dL — ABNORMAL HIGH (ref 91–123)
Hemoglobin A1C, External: 6.7 % — ABNORMAL HIGH (ref 4.8–5.6)

## 2021-04-17 ENCOUNTER — Emergency Department (INDEPENDENT_AMBULATORY_CARE_PROVIDER_SITE_OTHER)
Admission: EM | Admit: 2021-04-17 | Discharge: 2021-04-17 | Disposition: A | Discharge status: Home / Self Care | Payer: Medicare Other | Source: Home / Self Care

## 2021-04-17 ENCOUNTER — Other Ambulatory Visit: Payer: Self-pay

## 2021-04-17 ENCOUNTER — Emergency Department (INDEPENDENT_AMBULATORY_CARE_PROVIDER_SITE_OTHER): Payer: Medicare Other

## 2021-04-17 ENCOUNTER — Encounter: Payer: Self-pay | Admitting: Emergency Medicine

## 2021-04-17 DIAGNOSIS — J0100 Acute maxillary sinusitis, unspecified: Secondary | ICD-10-CM

## 2021-04-17 DIAGNOSIS — R059 Cough, unspecified: Secondary | ICD-10-CM

## 2021-04-17 DIAGNOSIS — H6692 Otitis media, unspecified, left ear: Secondary | ICD-10-CM

## 2021-04-17 DIAGNOSIS — J309 Allergic rhinitis, unspecified: Secondary | ICD-10-CM | POA: Diagnosis not present

## 2021-04-17 DIAGNOSIS — R0602 Shortness of breath: Secondary | ICD-10-CM

## 2021-04-17 MED ORDER — CEFDINIR 300 MG PO CAPS: 300.0000 mg | ORAL_CAPSULE | Freq: Two times a day (BID) | ORAL | 0 refills | Status: AC

## 2021-04-17 MED ORDER — PREDNISONE 20 MG PO TABS: ORAL_TABLET | ORAL | 0 refills | Status: AC

## 2021-04-17 MED ORDER — FEXOFENADINE HCL 180 MG PO TABS: 180.0000 mg | ORAL_TABLET | Freq: Every day | ORAL | 0 refills | Status: AC

## 2021-04-17 MED ORDER — PROMETHAZINE-DM 6.25-15 MG/5ML PO SYRP: 5.0000 mL | ORAL_SOLUTION | Freq: Two times a day (BID) | ORAL | 0 refills | Status: AC | PRN

## 2021-04-17 NOTE — ED Triage Notes (Signed)
Cough, HA, SOB x 1 week  Pt flew back from [place] yesterday evening  Coughing so hard it makes him SOB  Lives in [place] - staying here in [place] this week for work  Had a flu vaccine

## 2021-04-17 NOTE — Discharge Instructions (Addendum)
Advised patient of chest x-ray results-negative for acute cardiopulmonary process.  Advised patient to take medication as directed with food to completion.  Advised patient may use Promethazine DM this evening prior to bed for cough, advised patient of sedative effects of this medication.  Advised patient tomorrow morning to start Cefdinir, prednisone, and Allegra.  Advised patient to take Prednisone and Allegra with first dose of Cefdinir for the next 5 of 10 days.  Advised may use Allegra as needed afterwards for concurrent postnasal drip/drainage.  Encouraged patient to increase daily water intake while taking these medications.

## 2021-04-17 NOTE — ED Provider Notes (Signed)
[name]    CSN: [id number] Arrival date & time: 04/17/21  1721      History   Chief Complaint Chief Complaint  Patient presents with   Cough    HPI Bridger Pizzi is a 70 y.o. male.   HPI 70 year old male presents with cough, headache and shortness of breath for 1 week.  Reports flying back from [place] yesterday evening.  Reports coughing so hard that is made him short of breath.  Patient reports that he lives in [place] and staying in [place] this week for work.  PMH significant for HTN and HLD.  Past Medical History:  Diagnosis Date   High cholesterol    Hypertension     There are no problems to display for this patient.   Past Surgical History:  Procedure Laterality Date   HERNIA REPAIR         Home Medications    Prior to Admission medications   Medication Sig Start Date End Date Taking? Authorizing Provider  cefdinir (OMNICEF) 300 MG capsule Take 1 capsule (300 mg total) by mouth 2 (two) times daily for 10 days. 04/17/21 04/27/21 Yes Trevor Iha, FNP  fexofenadine [facility] ALLERGY) 180 MG tablet Take 1 tablet (180 mg total) by mouth daily for 15 days. 04/17/21 05/02/21 Yes Trevor Iha, FNP  predniSONE (DELTASONE) 20 MG tablet Take 3 tabs PO daily x 5 days. 04/17/21  Yes Trevor Iha, FNP  promethazine-dextromethorphan (PROMETHAZINE-DM) 6.25-15 MG/5ML syrup Take 5 mLs by mouth 2 (two) times daily as needed for cough. 04/17/21  Yes Trevor Iha, FNP  albuterol (VENTOLIN HFA) 108 (90 Base) MCG/ACT inhaler Inhale 2 puffs into the lungs every 4 (four) hours as needed for wheezing or shortness of breath. 09/21/19   Hall-Potvin, [place], PA-C  amphetamine-dextroamphetamine (ADDERALL) 5 MG tablet Take 5 mg by mouth daily.    [provider]  Ascorbic Acid (VITAMIN C PO) Take by mouth.    [provider]  atorvastatin (LIPITOR) 20 MG tablet Take 20 mg by mouth daily.    [provider]  FLUoxetine HCl  (PROZAC PO) Take by mouth. Patient not taking: Reported on 04/17/2021    [provider]  fluticasone (FLONASE) 50 MCG/ACT nasal spray Place 1 spray into both nostrils daily. Patient not taking: Reported on 04/17/2021 09/21/19   Hall-Potvin, [place], PA-C  irbesartan (AVAPRO) 150 MG tablet Take 150 mg by mouth daily.    [provider]  Multiple Vitamins-Minerals (ZINC PO) Take by mouth. Patient not taking: Reported on 04/17/2021    [provider]  sulfamethoxazole-trimethoprim (BACTRIM DS) 800-160 MG tablet Take 1 tablet by mouth 2 (two) times daily. Patient not taking: Reported on 04/17/2021 02/25/20   Wallis Bamberg, PA-C    Family History History reviewed. No pertinent family history.  Social History Social History   Tobacco Use   Smoking status: Never    Passive exposure: Never   Smokeless tobacco: Never  Vaping Use   Vaping Use: Never used  Substance Use Topics   Alcohol use: Yes    Comment: social   Drug use: Never     Allergies   Patient has no known allergies.   Review of Systems Review of Systems  HENT:  Positive for congestion.   Respiratory:  Positive for cough and shortness of breath.   Neurological:  Positive for headaches.  All other systems reviewed and are negative.   Physical Exam Triage Vital Signs ED Triage Vitals  Enc Vitals Group  BP 04/17/21 1753 (!) 142/87     Pulse Rate 04/17/21 1753 67     Resp 04/17/21 1753 20     Temp 04/17/21 1753 98.8 F (37.1 C)     Temp Source 04/17/21 1753 Oral     SpO2 04/17/21 1753 97 %     Weight 04/17/21 1757 254 lb (115.2 kg)     Height 04/17/21 1757 6\' 2"  (1.88 m)     Head Circumference --      Peak Flow --      Pain Score 04/17/21 1757 4     Pain Loc --      Pain Edu? --      Excl. in GC? --    No data found.  Updated Vital Signs BP (!) 142/87 (BP Location: Left Arm)    Pulse 67    Temp 98.8 F (37.1 C) (Oral)    Resp 20    Ht 6\' 2"  (1.88 m)    Wt 254 lb (115.2 kg)     SpO2 97%    BMI 32.61 kg/m    Physical Exam Vitals and nursing note reviewed.  Constitutional:      General: He is not in acute distress.    Appearance: Normal appearance. He is obese. He is ill-appearing.  HENT:     Head: Normocephalic and atraumatic.     Right Ear: Tympanic membrane and external ear normal.     Left Ear: External ear normal. Tympanic membrane is erythematous and bulging.     Ears:     Comments: Moderate eustachian tube dysfunction noted bilaterally    Mouth/Throat:     Mouth: Mucous membranes are moist.     Comments: Moderate amount of clear drainage of posterior oropharynx noted Eyes:     Extraocular Movements: Extraocular movements intact.     Conjunctiva/sclera: Conjunctivae normal.     Pupils: Pupils are equal, round, and reactive to light.  Cardiovascular:     Rate and Rhythm: Normal rate and regular rhythm.     Pulses: Normal pulses.     Heart sounds: Normal heart sounds.  Pulmonary:     Effort: Pulmonary effort is normal.     Breath sounds: Normal breath sounds. No wheezing, rhonchi or rales.     Comments: Infrequent nonproductive cough noted on exam Musculoskeletal:        General: Normal range of motion.     Cervical back: Normal range of motion and neck supple.  Skin:    General: Skin is warm and dry.  Neurological:     General: No focal deficit present.     Mental Status: He is alert and oriented to person, place, and time.     UC Treatments / Results  Labs (all labs ordered are listed, but only abnormal results are displayed) Labs Reviewed - No data to display  EKG   Radiology DG Chest 2 View  Result Date: 04/17/2021 CLINICAL DATA:  Shortness of breath with cough.  Recent travel. EXAM: CHEST - 2 VIEW COMPARISON:  None. FINDINGS: Midline trachea. Normal heart size and mediastinal contours. No pleural effusion or pneumothorax. Clear lungs. Mild convex right thoracolumbar spine curvature. IMPRESSION: No active cardiopulmonary disease.  Electronically Signed   By: M.D.   On: 04/17/2021 18:28    Procedures Procedures (including critical care time)  Medications Ordered in UC Medications - No data to display  Initial Impression / Assessment and Plan / UC Course  I have reviewed the triage  vital signs and the nursing notes.  Pertinent labs & imaging results that were available during my care of the patient were reviewed by me and considered in my medical decision making (see chart for details).     MDM: 1.  Cough-CXR revealed above-negative for acute cardiopulmonary process, Rx'd Promethazine DM; 2.  Acute maxillary sinusitis-Rx'd Prednisone; 3.  Acute left otitis media-Rx'd  Cefdinir; 4.  Allergic rhinitis-Rx'd Allegra. Advised patient of chest x-ray results-negative for acute cardiopulmonary process.  Advised patient to take medication as directed with food to completion.  Advised patient may use Promethazine DM this evening prior to bed for cough, advised patient of sedative effects of this medication.  Advised patient tomorrow morning to start Cefdinir, prednisone, and Allegra.  Advised patient to take Prednisone and Allegra with first dose of Cefdinir for the next 5 of 10 days.  Advised may use Allegra as needed afterwards for concurrent postnasal drip/drainage.  Encouraged patient to increase daily water intake while taking these medications.  Patient discharged home, hemodynamically stable. Final Clinical Impressions(s) / UC Diagnoses   Final diagnoses:  Cough, unspecified type  Allergic rhinitis, unspecified seasonality, unspecified trigger  Acute maxillary sinusitis, recurrence not specified  Acute left otitis media     Discharge Instructions      Advised patient of chest x-ray results-negative for acute cardiopulmonary process.  Advised patient to take medication as directed with food to completion.  Advised patient may use Promethazine DM this evening prior to bed for cough, advised patient of  sedative effects of this medication.  Advised patient tomorrow morning to start Cefdinir, prednisone, and Allegra.  Advised patient to take Prednisone and Allegra with first dose of Cefdinir for the next 5 of 10 days.  Advised may use Allegra as needed afterwards for concurrent postnasal drip/drainage.  Encouraged patient to increase daily water intake while taking these medications.     ED Prescriptions     Medication Sig Dispense Auth. Provider   cefdinir (OMNICEF) 300 MG capsule Take 1 capsule (300 mg total) by mouth 2 (two) times daily for 10 days. 20 capsule Trevor Iha, FNP   predniSONE (DELTASONE) 20 MG tablet Take 3 tabs PO daily x 5 days. 15 tablet Trevor Iha, FNP   fexofenadine [facility] ALLERGY) 180 MG tablet Take 1 tablet (180 mg total) by mouth daily for 15 days. 15 tablet Trevor Iha, FNP   promethazine-dextromethorphan (PROMETHAZINE-DM) 6.25-15 MG/5ML syrup Take 5 mLs by mouth 2 (two) times daily as needed for cough. 118 mL Trevor Iha, FNP      PDMP not reviewed this encounter.   Trevor Iha, FNP 04/17/21 1911

## 2021-04-17 NOTE — ED Provider Notes (Signed)
Formatting of this note is different from the original.  Images from the original note were not included.    [name]    CSN: [id number]  Arrival date & time: 04/17/21  1721        History    Chief Complaint  Chief Complaint   Patient presents with    Cough     HPI  Michael Galvan is a 70 y.o. male.     HPI 70 year old male presents with cough, headache and shortness of breath for 1 week.  Reports flying back from [place] yesterday evening.  Reports coughing so hard that is made him short of breath.  Patient reports that he lives in [place] and staying in [place] this week for work.  PMH significant for HTN and HLD.    Past Medical History:   Diagnosis Date    High cholesterol     Hypertension      There are no problems to display for this patient.    Past Surgical History:   Procedure Laterality Date    HERNIA REPAIR       Home Medications      Prior to Admission medications    Medication Sig Start Date End Date Taking? Authorizing Provider   cefdinir (OMNICEF) 300 MG capsule Take 1 capsule (300 mg total) by mouth 2 (two) times daily for 10 days. 04/17/21 04/27/21 Yes Trevor Iha, FNP   fexofenadine [facility] ALLERGY) 180 MG tablet Take 1 tablet (180 mg total) by mouth daily for 15 days. 04/17/21 05/02/21 Yes Trevor Iha, FNP   predniSONE (DELTASONE) 20 MG tablet Take 3 tabs PO daily x 5 days. 04/17/21  Yes Trevor Iha, FNP   promethazine-dextromethorphan (PROMETHAZINE-DM) 6.25-15 MG/5ML syrup Take 5 mLs by mouth 2 (two) times daily as needed for cough. 04/17/21  Yes Trevor Iha, FNP   albuterol (VENTOLIN HFA) 108 (90 Base) MCG/ACT inhaler Inhale 2 puffs into the lungs every 4 (four) hours as needed for wheezing or shortness of breath. 09/21/19   Hall-Potvin, [place], PA-C   amphetamine-dextroamphetamine (ADDERALL) 5 MG tablet Take 5 mg by mouth daily.    [provider]   Ascorbic Acid (VITAMIN C PO) Take by mouth.    [provider]   atorvastatin (LIPITOR) 20 MG  tablet Take 20 mg by mouth daily.    [provider]   FLUoxetine HCl (PROZAC PO) Take by mouth.  Patient not taking: Reported on 04/17/2021    [provider]   fluticasone (FLONASE) 50 MCG/ACT nasal spray Place 1 spray into both nostrils daily.  Patient not taking: Reported on 04/17/2021 09/21/19   Hall-Potvin, [place], PA-C   irbesartan (AVAPRO) 150 MG tablet Take 150 mg by mouth daily.    [provider]   Multiple Vitamins-Minerals (ZINC PO) Take by mouth.  Patient not taking: Reported on 04/17/2021    [provider]   sulfamethoxazole-trimethoprim (BACTRIM DS) 800-160 MG tablet Take 1 tablet by mouth 2 (two) times daily.  Patient not taking: Reported on 04/17/2021 02/25/20   Wallis Bamberg, PA-C     Family History  History reviewed. No pertinent family history.    Social History  Social History     Tobacco Use    Smoking status: Never     Passive exposure: Never    Smokeless tobacco: Never   Vaping Use    Vaping Use: Never used   Substance Use Topics    Alcohol use: Yes     Comment:  social    Drug use: Never     Allergies    Patient has no known allergies.    Review of Systems  Review of Systems   HENT:  Positive for congestion.    Respiratory:  Positive for cough and shortness of breath.    Neurological:  Positive for headaches.   All other systems reviewed and are negative.    Physical Exam  Triage Vital Signs  ED Triage Vitals   Enc Vitals Group      BP 04/17/21 1753 (!) 142/87      Pulse Rate 04/17/21 1753 67      Resp 04/17/21 1753 20      Temp 04/17/21 1753 98.8 F (37.1 C)      Temp Source 04/17/21 1753 Oral      SpO2 04/17/21 1753 97 %      Weight 04/17/21 1757 254 lb (115.2 kg)      Height 04/17/21 1757 6\' 2"  (1.88 m)      Head Circumference --       Peak Flow --       Pain Score 04/17/21 1757 4      Pain Loc --       Pain Edu? --       Excl. in GC? --      No data found.    Updated Vital Signs  BP (!) 142/87 (BP Location: Left Arm)   Pulse 67   Temp 98.8 F (37.1  C) (Oral)   Resp 20   Ht 6\' 2"  (1.88 m)   Wt 254 lb (115.2 kg)   SpO2 97%   BMI 32.61 kg/m     Physical Exam  Vitals and nursing note reviewed.   Constitutional:       General: He is not in acute distress.     Appearance: Normal appearance. He is obese. He is ill-appearing.   HENT:      Head: Normocephalic and atraumatic.      Right Ear: Tympanic membrane and external ear normal.      Left Ear: External ear normal. Tympanic membrane is erythematous and bulging.      Ears:      Comments: Moderate eustachian tube dysfunction noted bilaterally     Mouth/Throat:      Mouth: Mucous membranes are moist.      Comments: Moderate amount of clear drainage of posterior oropharynx noted  Eyes:      Extraocular Movements: Extraocular movements intact.      Conjunctiva/sclera: Conjunctivae normal.      Pupils: Pupils are equal, round, and reactive to light.   Cardiovascular:      Rate and Rhythm: Normal rate and regular rhythm.      Pulses: Normal pulses.      Heart sounds: Normal heart sounds.   Pulmonary:      Effort: Pulmonary effort is normal.      Breath sounds: Normal breath sounds. No wheezing, rhonchi or rales.      Comments: Infrequent nonproductive cough noted on exam  Musculoskeletal:         General: Normal range of motion.      Cervical back: Normal range of motion and neck supple.   Skin:     General: Skin is warm and dry.   Neurological:      General: No focal deficit present.      Mental Status: He is alert and oriented to person, place, and time.  UC Treatments / Results   Labs  (all labs ordered are listed, but only abnormal results are displayed)  Labs Reviewed - No data to display    EKG    Radiology  DG Chest 2 View    Result Date: 04/17/2021  CLINICAL DATA:  Shortness of breath with cough.  Recent travel. EXAM: CHEST - 2 VIEW COMPARISON:  None. FINDINGS: Midline trachea. Normal heart size and mediastinal contours. No pleural effusion or pneumothorax. Clear lungs. Mild convex right thoracolumbar  spine curvature. IMPRESSION: No active cardiopulmonary disease. Electronically Signed   By: Jeronimo Greaves M.D.   On: 04/17/2021 18:28      Procedures  Procedures (including critical care time)    Medications Ordered in UC  Medications - No data to display    Initial Impression / Assessment and Plan / UC Course   I have reviewed the triage vital signs and the nursing notes.    Pertinent labs & imaging results that were available during my care of the patient were reviewed by me and considered in my medical decision making (see chart for details).        MDM: 1.  Cough-CXR revealed above-negative for acute cardiopulmonary process, Rx'd Promethazine DM; 2.  Acute maxillary sinusitis-Rx'd Prednisone; 3.  Acute left otitis media-Rx'd  Cefdinir; 4.  Allergic rhinitis-Rx'd Allegra. Advised patient of chest x-ray results-negative for acute cardiopulmonary process.  Advised patient to take medication as directed with food to completion.  Advised patient may use Promethazine DM this evening prior to bed for cough, advised patient of sedative effects of this medication.  Advised patient tomorrow morning to start Cefdinir, prednisone, and Allegra.  Advised patient to take Prednisone and Allegra with first dose of Cefdinir for the next 5 of 10 days.  Advised may use Allegra as needed afterwards for concurrent postnasal drip/drainage.  Encouraged patient to increase daily water intake while taking these medications.  Patient discharged home, hemodynamically stable.  Final Clinical Impressions(s) / UC Diagnoses     Final diagnoses:   Cough, unspecified type   Allergic rhinitis, unspecified seasonality, unspecified trigger   Acute maxillary sinusitis, recurrence not specified   Acute left otitis media     Discharge Instructions        Advised patient of chest x-ray results-negative for acute cardiopulmonary process.  Advised patient to take medication as directed with food to completion.  Advised patient may use Promethazine DM this  evening prior to bed for cough, advised patient of sedative effects of this medication.  Advised patient tomorrow morning to start Cefdinir, prednisone, and Allegra.  Advised patient to take Prednisone and Allegra with first dose of Cefdinir for the next 5 of 10 days.  Advised may use Allegra as needed afterwards for concurrent postnasal drip/drainage.  Encouraged patient to increase daily water intake while taking these medications.    ED Prescriptions       Medication Sig Dispense Auth. Provider    cefdinir (OMNICEF) 300 MG capsule Take 1 capsule (300 mg total) by mouth 2 (two) times daily for 10 days. 20 capsule Trevor Iha, FNP    predniSONE (DELTASONE) 20 MG tablet Take 3 tabs PO daily x 5 days. 15 tablet Trevor Iha, FNP    fexofenadine [facility] ALLERGY) 180 MG tablet Take 1 tablet (180 mg total) by mouth daily for 15 days. 15 tablet Trevor Iha, FNP    promethazine-dextromethorphan (PROMETHAZINE-DM) 6.25-15 MG/5ML syrup Take 5 mLs by mouth 2 (two) times daily as needed for cough.  118 mL Trevor Iha, FNP         PDMP not reviewed this encounter.    Trevor Iha, FNP  04/17/21 1911    Electronically signed by Trevor Iha, FNP at 04/17/2021  7:11 PM EST

## 2021-04-17 NOTE — ED Triage Notes (Signed)
Formatting of this note might be different from the original.  Cough, HA, SOB x 1 week   Pt flew back from [place] yesterday evening   Coughing so hard it makes him SOB   Lives in [place] - staying here in [place] this week for work   Had a flu vaccine   Electronically signed by Amil Amen, RN at 04/17/2021  5:57 PM EST

## 2021-07-14 LAB — HEMOGLOBIN A1C
Estimated Avg Glucose, External: 142 mg/dL — ABNORMAL HIGH (ref 91–123)
Hemoglobin A1C, External: 6.6 % — ABNORMAL HIGH (ref 4.8–5.6)

## 2021-08-13 NOTE — Anesthesia Post-Procedure Evaluation (Signed)
Formatting of this note is different from the original.  Patient: Michael Galvan    Procedure(s):  COLONOSCOPY WITH POLYPECTOMY    Procedure Summary     Date: 08/13/21 Room / Location: [place] [facility] / [facility] OR    Anesthesia Start: 1155 Anesthesia Stop: 1109    Procedure: COLONOSCOPY WITH POLYPECTOMY Diagnosis:       Screening for colon cancer      History of adenomatous polyp of colon      (Screening for colon cancer [Z12.11])      (History of adenomatous polyp of colon [Z86.010])    Surgeons: Stacy Gardner, MD Responsible Provider: Niel Hummer, MD    Anesthesia Type: MAC ASA Status: 3       Anesthesia Type: MAC  Last vitals  Vitals Value Taken Time   BP 145/80 08/13/21 1130   Temp 97.3 F (36.3 C) 08/13/21 1116   Pulse 65 08/13/21 1130   Resp 14 08/13/21 1130   SpO2 98 % 08/13/21 1130     Patient location during evaluation: bedside  Patient participation: complete - patient participated  Level of consciousness: awake  Pain score: 0  Pain management: adequate  no PONV   Other Anesthetic complications: no  Cardiovascular status: acceptable  Respiratory status: acceptable  Hydration status: acceptable      Electronically signed by Niel Hummer, MD at 08/13/2021 12:00 PM EDT

## 2021-08-13 NOTE — Anesthesia Pre-Procedure Evaluation (Signed)
Summary: pt on metformin    Formatting of this note is different from the original.  Relevant Problems   ANESTHESIA   (+) OSA on CPAP     PULMONARY   (+) OSA on CPAP     No history of Anesthesia complications    Allergies:  Patient has no known allergies.    Past Medical History:   Diagnosis Date   ? Diverticulitis of colon     Last flare up 12/2015   ? Headache    ? History of adenomatous polyp of colon    ? Hypertension    ? OSA (obstructive sleep apnea)     uses cpap    ? Skin cancer     shoulder      Past Surgical History:   Procedure Laterality Date   ? COLONOSCOPY     ? HERNIA REPAIR      right inguinal, umbilical   ? KNEE ARTHROSCOPY W/ ACL RECONSTRUCTION Bilateral    ? PR COLSC FLX W/RMVL OF TUMOR POLYP LESION SNARE TQ N/A 06/21/2016    Procedure: COLONOSCOPY W/ POLYPECTOMY BY SNARE (ANY SNARE TECHNIQUE);  Surgeon: Stacy Gardner, MD;  Location: [facility] OR;  Service: Gastroenterology   ? SKIN CANCER EXCISION Left 2015    shoulder      Prior to Admission medications    Medication Sig Start Date End Date Taking? Authorizing Provider   acetaminophen-codeine (TYLENOL with CODEINE #3) 300-30 MG per tablet TAKE ONE TABLET BY MOUTH ONCE A WEEK AS NEEDED 05/18/21   Historical Provider, MD   albuterol HFA (PROVENTIL HFA;VENTOLIN HFA; PROAIR HFA) 108 (90 Base) MCG/ACT inhaler Inhale 05/30/20   Historical Provider, MD   amphetamine-dextroamphetamine (ADDERALL) 10 MG tablet 1/2-1 tab po BID for daytime sleepiness 06/15/21   Peggye Ley, MD   amphetamine-dextroamphetamine (ADDERALL) 5 MG tablet Take 1 tablet (5 mg total) by mouth daily 06/26/21   Peggye Ley, MD   Ascorbic Acid (VITAMIN C PO) Take by mouth    Historical Provider, MD   atorvastatin (LIPITOR) 20 MG tablet Take 1 tablet by mouth At Bedtime 06/05/18   Historical Provider, MD   bisacodyl (Dulcolax) 5 MG EC tablet Take as directed 08/08/21   Stacy Gardner, MD   cetirizine (ZyrTEC) 10 MG tablet Take 10 mg by mouth 12/17/19   Historical Provider, MD    FLUoxetine (PROZAC) 10 MG capsule Take 1 tablet by mouth daily. 05/24/16   Clyde Lundborg, MD   FLUoxetine (PROzac) 10 MG capsule Take 10 mg by mouth daily 02/25/20   Historical Provider, MD   irbesartan-hydrochlorothiazide (AVALIDE) 150-12.5 MG per tablet Take 1 tablet by mouth daily 07/26/20   Historical Provider, MD   metFORMIN XR (GLUCOPHAGE-XR) 500 MG 24 hr tablet Take 1,000 mg by mouth daily Two tablets once daily. 09/25/20   Historical Provider, MD   polyethylene glycol (MiraLax) 17 GM/SCOOP powder Take as a split dose as directed. 08/08/21   Stacy Gardner, MD     Medications  Scheduled:    Infusions:  lactated Ringer's, 60 mL/hr    PRN:    Physical Exam    Airway  Mallampati: II  TM distance: >3 FB  Neck ROM: full  Patient's mouth opening is normal   Cardiovascular   Rhythm: regular      Dental - normal exam    Pulmonary   Breath sounds clear to auscultation      Anesthesia Plan    ASA 3  MAC     intravenous induction   Anesthetic plan and risks discussed with patient.  NPO status verified      Electronically signed by Niel Hummer, MD at 08/13/2021 10:19 AM EDT

## 2022-01-11 NOTE — Other (Signed)
Formatting of this note might be different from the original.  Left message for pt to call office regarding lab results.  Electronically signed by Marianne Sofia, LPN at [id number]/9163 11:58 AM EDT

## 2022-01-11 NOTE — Other (Signed)
Formatting of this note might be different from the original.  A1c for sugar is up slightly to 6.8.  Cholesterol is good at 141.  Continue same meds and dietary efforts.  Electronically signed by Evern Core, MD at 01/14/2022  6:47 AM EDT

## 2022-01-11 NOTE — Progress Notes (Signed)
Formatting of this note might be different from the original.  The patient's blood was drawn by venipuncture.  Specimen was processed and sent to the lab.  The patient tolerated the procedure without incident.       Lavender 1, SST  1  were drawn from the patient.    Rowan Blase s [place]  Electronically signed by Curley Spice at 01/11/2022  8:44 AM EDT

## 2022-01-12 LAB — HEMOGLOBIN A1C
Estimated Avg Glucose, External: 148 mg/dL — ABNORMAL HIGH (ref 91–123)
Hemoglobin A1C, External: 6.8 % — ABNORMAL HIGH (ref 4.8–5.6)

## 2022-02-25 ENCOUNTER — Encounter

## 2022-02-25 ENCOUNTER — Inpatient Hospital Stay: Admit: 2022-02-25 | Payer: MEDICARE | Primary: Internal Medicine

## 2022-02-25 DIAGNOSIS — Z01818 Encounter for other preprocedural examination: Secondary | ICD-10-CM

## 2022-02-25 DIAGNOSIS — M7542 Impingement syndrome of left shoulder: Secondary | ICD-10-CM

## 2022-02-25 LAB — COMPREHENSIVE METABOLIC PANEL
ALT: 50 U/L (ref 16–61)
AST: 33 U/L (ref 10–38)
Albumin/Globulin Ratio: 0.9 (ref 0.8–1.7)
Albumin: 3.7 g/dL (ref 3.4–5.0)
Alk Phosphatase: 82 U/L (ref 45–117)
Anion Gap: 7 mmol/L (ref 3.0–18)
BUN: 14 MG/DL (ref 7.0–18)
Bun/Cre Ratio: 19 (ref 12–20)
CO2: 27 mmol/L (ref 21–32)
Calcium: 9 MG/DL (ref 8.5–10.1)
Chloride: 101 mmol/L (ref 100–111)
Creatinine: 0.74 MG/DL (ref 0.6–1.3)
Est, Glom Filt Rate: >60 mL/min/{1.73_m2} (ref >60)
Globulin: 3.9 g/dL (ref 2.0–4.0)
Glucose: 108 mg/dL — ABNORMAL HIGH (ref 74–99)
Potassium: 3.9 mmol/L (ref 3.5–5.5)
Sodium: 135 mmol/L — ABNORMAL LOW (ref 136–145)
Total Bilirubin: 0.8 MG/DL (ref 0.2–1.0)
Total Protein: 7.6 g/dL (ref 6.4–8.2)

## 2022-02-25 LAB — CBC
Hematocrit: 42.7 % (ref 36.0–48.0)
Hemoglobin: 13.6 g/dL (ref 13.0–16.0)
MCH: 29.8 PG (ref 24.0–34.0)
MCHC: 31.9 g/dL (ref 31.0–37.0)
MCV: 93.4 FL (ref 78.0–100.0)
MPV: 10.9 FL (ref 9.2–11.8)
Nucleated RBCs: 0 PER 100 WBC
Platelets: 197 10*3/uL (ref 135–420)
RBC: 4.57 M/uL (ref 4.35–5.65)
RDW: 14.1 % (ref 11.6–14.5)
WBC: 8.5 10*3/uL (ref 4.6–13.2)
nRBC: 0 10*3/uL (ref 0.00–0.01)

## 2022-02-25 LAB — HEMOGLOBIN A1C
Hemoglobin A1C: 6.4 % — ABNORMAL HIGH (ref 4.2–5.6)
eAG: 137 mg/dL

## 2022-02-26 LAB — EKG 12-LEAD
Atrial Rate: 63 {beats}/min
Diagnosis: NORMAL
P Axis: 52 degrees
P-R Interval: 178 ms
Q-T Interval: 424 ms
QRS Duration: 96 ms
QTc Calculation (Bazett): 433 ms
R Axis: 12 degrees
T Axis: 34 degrees
Ventricular Rate: 63 {beats}/min

## 2022-04-08 ENCOUNTER — Inpatient Hospital Stay: Payer: MEDICARE | Primary: Internal Medicine

## 2022-04-08 NOTE — Other (Signed)
Order for procedure consent placed in patient chart according to MD order on surgical posting, verbatim.   Patient states, no Covid infection over last 90 days. Patient states "no implanted devices requiring a remote control".   Patient instructed as part of pre-op teaching not to bring any valuables including Wallet, jewelry, cell phone, lap top or tablet.   Patient also instructed not to wear anything on skin including deodorant, cologne, creams or sprays.    Patient confirmed receipt of [facility] skin preparation and instructions.

## 2022-04-08 NOTE — H&P (Signed)
History and Physical        Patient: Michael Galvan               Sex: male          DOA: (Not on file)         Date of Birth:  [birth year]      Age:  71 y.o.        LOS:  LOS: 0 days        HPI:     Pasha is in for followup of his left shoulder bursitis/AC DJD/partial rotator cuff tearing/SLAP lesion by MRI/left probable carpal tunnel syndrome/left moderate medial tibiofemoral/patellofemoral DJD status post ACL reconstruction by Dr Sydnee Cabal with retained metallic screws. I have gone over the MRI of his left shoulder today and we discussed things in detail.  He is also complaining of some paresthesias in his left hand that are waking him at night.    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  Otherwise, no periosteal reaction, no medullary lesions, no osteopenia,  no chondrolysis, no fractures, and no abnormal calcifications.  No past medical history on file.    No past surgical history on file.    No family history on file.    Social History     Socioeconomic History    Marital status: Married       Prior to Admission medications    Medication Sig Start Date End Date Taking? Authorizing Provider   albuterol sulfate HFA (PROVENTIL;VENTOLIN;PROAIR) 108 (90 Base) MCG/ACT inhaler Inhale 1 puff into the lungs every 6 hours as needed 09/21/19   Automatic Reconciliation, Ar   amphetamine-dextroamphetamine (ADDERALL) 5 MG tablet Take 5 mg by mouth daily.    Automatic Reconciliation, Ar   atorvastatin (LIPITOR) 20 MG tablet Take 20 mg by mouth 02/25/20   Automatic Reconciliation, Ar   FLUoxetine (PROZAC) 10 MG capsule Take 10 mg by mouth daily 02/25/20   Automatic Reconciliation, Ar   fluticasone (FLONASE) 50 MCG/ACT nasal spray 1 spray by Nasal route daily 09/21/19   Automatic Reconciliation, Ar   irbesartan-hydroCHLOROthiazide (AVALIDE) 150-12.5 MG per tablet Take 1 tablet by mouth daily  02/25/20   Automatic Reconciliation, Ar   metFORMIN (GLUCOPHAGE-XR) 500 MG extended release tablet Take 1,000 mg by mouth daily 03/08/20   Automatic Reconciliation, Ar       Not on File    Review of Systems  GENERAL:  Patient has no signs of fever, chills or weight change.  HEAD/ENTM:  Patient has no signs of headaches, dizziness, hearing loss, ringing in ears, sore throat/hoarseness, recent cold, double vision, blurred vision, itchy eyes, eye redness or eye discharge.  CARDIOVASCULAR:  Patient has no signs of chest pain, palpitations, rheumatic fever or heart murmur.  RESPIRATORY:  Patient has no signs of chronic cough, wheezing, difficulty breathing, pain on breathing or shortness of breath.  GASTROINTESTINAL:  Patient has no signs of nausea/vomiting, difficulty swallowing, gas/bloating, indigestion, abdominal pain, diarrhea, bloody stools or hemorrhoids.  GENITOURINARY:  Patient has no signs of blood in urine, painful urinating, burning sensation, bladder/kidney infection, frequent urinating or incontinence.  MUSCULOSKELETAL: Patient presents with tendonitis and joint pain. Patient has no signs of fracture/dislocation, sprain/strain, joint stiffness, rheumatoid disease, gout or swelling of feet.  INTEGUMENTARY:  Patient has no signs of rash/itching, psoriasis, Raynaud's phenomenon or varicose veins.  EMOTIONAL:  Patient has  no signs of anxiety, depression, bipolar disorder, memory loss or change in mood.      Physical Exam:      There were no vitals taken for this visit.    Physical Exam:   Physical examination of the patient's left shoulder shows he does have weakness about 4+/5 with empty can testing.  He does impinge markedly with total elevation of 180 degrees. He has some tenderness with palpation of the AC joint.  Pain with cross-over adduction in the left shoulder in the horizontal plane.  External rotation testing is virtually 5/5 with his elbow at his side.  The left shoulder has no pain at the biceps  groove.  The skin has no swelling, ecchymosis, or wounds.  There is full range of motion in all four directions similar to the opposite shoulder.  The patient has a negative painful arc test as well as a negative abduction impingement sign.  The patient has full external rotation of the shoulder keeping the elbow at the side that is comparable to the opposite shoulder.  In the supine position, the patient has no abduction or external rotation apprehension sign and has a negative relocation maneuver.  The patient has a negative sulcus sign. The patient has a 2+ radial pulse, excellent grip strength of the hand.      Assessment/Plan     Principal Problem:    Incomplete tear of left rotator cuff  Resolved Problems:    * No resolved hospital problems. *    I ordered EMG of the left upper extremity so we can see if he has carpal tunnel syndrome.  I discussed surgery for his left shoulder and maybe in the postoperative period we can fix his left carpal tunnel once his shoulder is better enough to be positioned.  The patient is going to be scheduled for a left shoulder arthroscopic subacromial decompression, arthroscopic distal clavicle excision and arthroscopic rotator cuff repair, arthroscopic long head of the biceps tenodesis.  The risks associated with the procedure including pain, bleeding, infection amongst others were discussed and questions were answered.  The patient understands these risks and is willing to proceed. The patient was issued a sling today.  The patient will be in the sling for three to six weeks postoperatively depending on the rotator cuff repair and will begin physical therapy thereafter.  The patient will follow up with Dr. Yolanda Bonine 10 days postoperatively.  Passive motion postoperatively was discussed as well as the importance of ice and elevation.  We have issued a prescription for Keflex for antibiotic prophylaxis and a prescription for Roxicodone today for postoperative pain medication. We are  going to do this at [facility]. They have a trip planned to [place] and [place] in mid-February and if we can get it done sooner then I think this should not be a problem with his trip.  We discussed left ECTR in the postop period of his left shoulder.

## 2022-04-08 NOTE — Other (Signed)
PAT Activity Status Questionnaire       Yes No Points for YES    Are you able to climb a flight of stairs or walk up a hill? [x] [] 1   Are you able to do heavy work around the house like lifting or moving heavy furniture? [x] [] 1   Do yardwork like raking leaves, weeding or pushing a power mower? [x] [] 1   Participate in strenuous sports like swimming, singles tennis, football, basketball or skiing? [] [x] 1   Total Score: 3       If TOTAL SCORE is <3, send chart for anesthesia review.

## 2022-04-11 NOTE — Progress Notes (Signed)
Formatting of this note might be different from the original.  Sx clearance form signed and faxed to [facility]. Confirmation received.   Electronically signed by Ander Gaster, NP at 04/11/2022  8:47 AM EST

## 2022-04-17 NOTE — Discharge Instructions (Addendum)
Michael Galvan, III, MD  Urbano Heir, PA-C    Upper Extremity Surgery  Discharge Instructions      Please take the time to review the following instructions before you leave the hospital and use them as guidelines during your recovery from surgery.     Wound Care/Dressing/Showering/Bathing:      []   You may remove your dressing and shower 2 days after your surgery. You may get your incisions wet in the shower. Do not vigorously scrub your incisions. Apply a band-aid after you have dried your incisions if there is any drainage. If there is no drainage no band-aid is needed. Do not immerse your incisions under water.  It isn't necessary to apply antibiotic ointment to your incisions.    []   Don't remove your dressing or get the dressing wet.    Sling:    []   You are not required to wear a sling and should do so only as needed for comfort. You have no restrictions with regards to the movement of your arm.   You may resume your normal daily activities ASAP and return to work as soon as you feel it is appropriate.    [x]   Keep your arm in the immobilizer at all times except when showering and changing your clothes.  When showering or changing, keep your arm at your side.  Don't move it away from your body.    Ice/Elevation:    Continue ice and elevation consistently for 48-72 hours after surgery( elevation is more important than ice.).  After 48 hours, you should ice when needed.    Diet:    You may advance to your regular diet as tolerated.        Medication:    You have been given a prescription for pain medication from the office at the time the surgery was scheduled. Take the medication as needed according to the directions on the prescription bottle. Discontinue the use of the pain medication if you develop itching, rash, shortness of breath or difficulties swallowing. If  these symptoms become severe or aren't relieved by discontinuing the medication  you should seek immediate medical attention.  Pain  medication may change the effects of any antidepressant medication you are taking. Refills of pain medication are authorized during office hours only. (8am - 5pm--Mon. thru Fri.)     You may take over the counter Advil or Aleve between dosages of your pain medication if needed.  Do not take Tylenol in addition to your pain medication as most of the pain medication already contains Tylenol.  Exceptions include Dilaudid(hydromorphone) and Roxicodone where you may add Tylenol.  Do not exceed 3000 mg of Tylenol per day.     You may resume the medication you were taking prior to your surgery.      Follow Up Appointment:    Your follow up appointment should be 10-14 days after surgery.  This appointment was set at time your surgery was scheduled.      Physical Therapy:    []   If you already have a therapy appointment, please be sure to attend your sessions as scheduled.     [x]   Physical Therapy will be discussed with you at your first follow-up appointment.  You don't need to begin physical therapy prior to that.    []   You do not require Physical Therapy.     Important Signs and Symptoms:    If any of the following signs and symptoms occur, you  should contact the office. Please be advised if a problem arises which you feel requires immediate medical attention or you are unable to contact the office you should seek immediate medical attention.     Signs and symptoms to watch for include:     1.   A sudden increase in swelling and l or redness or warm that the area your surgery was performed which isn't relieved by rest, ice, pain medication and elevation.   2.   Oral temperature greater than 101.5 degrees for 12 hours or more which isn't relieved by an increase in fluid intake and taking two Tylenol every 6 hours.   3.   Excessive drainage from your incisions, or drainage which hasn't stopped by 72 hours after your surgery despite applying a compressive dressing, ice and elevation.   4.   Calf pain, tenderness, redness  or swelling which isn't relieved with rest and elevation.   5.   Fever, chills, shortness ofbreath, chest pain, nausea, vomiting or other signs and symptoms which are of concern to you.     DISCHARGE SUMMARY from Nurse    PATIENT INSTRUCTIONS:    After general anesthesia or intravenous sedation, for 24 hours or while taking prescription Narcotics:  Limit your activities  Do not drive and operate hazardous machinery  Do not make important personal or business decisions  Do  not drink alcoholic beverages  If you have not urinated within 8 hours after discharge, please contact your surgeon on call.    Report the following to your surgeon:  Excessive pain, swelling, redness or odor of or around the surgical area  Temperature over 100.5  Nausea and vomiting lasting longer than 4 hours or if unable to take medications  Any signs of decreased circulation or nerve impairment to extremity: change in color, persistent  numbness, tingling, coldness or increase pain  Any questions    What to do at Home:  Recommended activity: activity as tolerated and no driving until advised by doctor.     *  Please give a list of your current medications to your Primary Care Provider.    *  Please update this list whenever your medications are discontinued, doses are      changed, or new medications (including over-the-counter products) are added.    *  Please carry medication information at all times in case of emergency situations.    These are general instructions for a healthy lifestyle:    No smoking/ No tobacco products/ Avoid exposure to second hand smoke  Surgeon General's Warning:  Quitting smoking now greatly reduces serious risk to your health.    Obesity, smoking, and sedentary lifestyle greatly increases your risk for illness    A healthy diet, regular physical exercise & weight monitoring are important for maintaining a healthy lifestyle    You may be retaining fluid if you have a history of heart failure or if you experience  any of the following symptoms:  Weight gain of 3 pounds or more overnight or 5 pounds in a week, increased swelling in our hands or feet or shortness of breath while lying flat in bed.  Please call your doctor as soon as you notice any of these symptoms; do not wait until your next office visit.    Patient armband removed and shredded.     The discharge information has been reviewed with the patient and caregiver.  The patient and caregiver verbalized understanding.  Discharge medications reviewed with the patient and  caregiver and appropriate educational materials and side effects teaching were provided.  ___________________________________________________________________________________________________________________________________

## 2022-04-18 ENCOUNTER — Inpatient Hospital Stay: Payer: MEDICARE | Attending: Orthopaedic Surgery

## 2022-04-18 LAB — POCT GLUCOSE: POC Glucose: 115 mg/dL — ABNORMAL HIGH (ref 70–110)

## 2022-04-18 MED ORDER — ESMOLOL HCL 100 MG/10ML IV SOLN
100 MG/10ML | INTRAVENOUS | Status: AC
Start: 2022-04-18 — End: ?

## 2022-04-18 MED ORDER — PROPOFOL 200 MG/20ML IV EMUL
200 MG/20ML | INTRAVENOUS | Status: DC | PRN
Start: 2022-04-18 — End: 2022-04-18
  Administered 2022-04-18: 17:00:00 75 via INTRAVENOUS
  Administered 2022-04-18 (×2): 20 via INTRAVENOUS

## 2022-04-18 MED ORDER — MIDAZOLAM HCL 2 MG/2ML IJ SOLN
2 MG/ML | INTRAMUSCULAR | Status: AC
Start: 2022-04-18 — End: 2022-04-18

## 2022-04-18 MED ORDER — BUPIVACAINE HCL (PF) 0.5 % IJ SOLN
0.5 % | INTRAMUSCULAR | Status: AC
Start: 2022-04-18 — End: ?

## 2022-04-18 MED ORDER — DEXAMETHASONE SOD PHOSPHATE PF 10 MG/ML IJ SOLN
10 MG/ML | INTRAMUSCULAR | Status: AC
Start: 2022-04-18 — End: 2022-04-18

## 2022-04-18 MED ORDER — ONDANSETRON HCL 4 MG/2ML IJ SOLN
4 MG/2ML | INTRAMUSCULAR | Status: AC
Start: 2022-04-18 — End: ?

## 2022-04-18 MED ORDER — MEPIVACAINE HCL (PF) 1.5 % IJ SOLN
1.5 % | INTRAMUSCULAR | Status: AC
Start: 2022-04-18 — End: 2022-04-18
  Administered 2022-04-18: 16:00:00 5 via PERINEURAL

## 2022-04-18 MED ORDER — DEXAMETHASONE SOD PHOSPHATE PF 10 MG/ML IJ SOLN
10 MG/ML | INTRAMUSCULAR | Status: AC
Start: 2022-04-18 — End: 2022-04-18
  Administered 2022-04-18: 16:00:00 4

## 2022-04-18 MED ORDER — ESMOLOL HCL 100 MG/10ML IV SOLN
100 MG/10ML | INTRAVENOUS | Status: DC | PRN
Start: 2022-04-18 — End: 2022-04-18
  Administered 2022-04-18 (×2): 20 via INTRAVENOUS

## 2022-04-18 MED ORDER — LABETALOL HCL 5 MG/ML IV SOLN
5 MG/ML | INTRAVENOUS | Status: DC | PRN
Start: 2022-04-18 — End: 2022-04-18

## 2022-04-18 MED ORDER — MIDAZOLAM HCL 2 MG/2ML IJ SOLN
2 MG/ML | INTRAMUSCULAR | Status: DC | PRN
Start: 2022-04-18 — End: 2022-04-18
  Administered 2022-04-18: 16:00:00 4 via INTRAVENOUS

## 2022-04-18 MED ORDER — LIDOCAINE HCL (CARDIAC) PF 100 MG/5ML IV SOLN
100 MG/5ML | INTRAVENOUS | Status: AC
Start: 2022-04-18 — End: ?

## 2022-04-18 MED ORDER — LACTATED RINGERS IV SOLN
INTRAVENOUS | Status: DC
Start: 2022-04-18 — End: 2022-04-18

## 2022-04-18 MED ORDER — ROPIVACAINE HCL 5 MG/ML IJ SOLN
INTRAMUSCULAR | Status: AC
Start: 2022-04-18 — End: 2022-04-18
  Administered 2022-04-18: 16:00:00 20 via PERINEURAL

## 2022-04-18 MED ORDER — ONDANSETRON HCL 4 MG/2ML IJ SOLN
4 MG/2ML | Freq: Once | INTRAMUSCULAR | Status: DC | PRN
Start: 2022-04-18 — End: 2022-04-18

## 2022-04-18 MED ORDER — OXYCODONE HCL 5 MG PO TABS
5 MG | ORAL_TABLET | ORAL | 0 refills | Status: AC | PRN
Start: 2022-04-18 — End: 2022-04-25

## 2022-04-18 MED ORDER — EPINEPHRINE PF 1 MG/ML IJ SOLN
1 MG/ML | INTRAMUSCULAR | Status: AC
Start: 2022-04-18 — End: ?

## 2022-04-18 MED ORDER — DIPHENHYDRAMINE HCL 50 MG/ML IJ SOLN
50 MG/ML | Freq: Once | INTRAMUSCULAR | Status: DC | PRN
Start: 2022-04-18 — End: 2022-04-18

## 2022-04-18 MED ORDER — DEXMEDETOMIDINE HCL 200 MCG/2ML IV SOLN
200 MCG/2ML | INTRAVENOUS | Status: AC
Start: 2022-04-18 — End: 2022-04-18
  Administered 2022-04-18: 16:00:00 .02 via PERINEURAL

## 2022-04-18 MED ORDER — PROPOFOL 200 MG/20ML IV EMUL
200 MG/20ML | INTRAVENOUS | Status: AC
Start: 2022-04-18 — End: ?

## 2022-04-18 MED ORDER — SODIUM CHLORIDE 0.9 % IV SOLN
0.9 % | INTRAVENOUS | Status: DC | PRN
Start: 2022-04-18 — End: 2022-04-18

## 2022-04-18 MED ORDER — ACETAMINOPHEN 325 MG PO TABS
325 MG | Freq: Once | ORAL | Status: DC | PRN
Start: 2022-04-18 — End: 2022-04-18

## 2022-04-18 MED ORDER — NORMAL SALINE FLUSH 0.9 % IV SOLN
0.9 % | Freq: Two times a day (BID) | INTRAVENOUS | Status: DC
Start: 2022-04-18 — End: 2022-04-18

## 2022-04-18 MED ORDER — LACTATED RINGERS IV SOLN
INTRAVENOUS | Status: DC
Start: 2022-04-18 — End: 2022-04-18
  Administered 2022-04-18 (×2): via INTRAVENOUS

## 2022-04-18 MED ORDER — HYDROMORPHONE HCL PF 1 MG/ML IJ SOLN
1 MG/ML | INTRAMUSCULAR | Status: DC | PRN
Start: 2022-04-18 — End: 2022-04-18

## 2022-04-18 MED ORDER — LIDOCAINE HCL (CARDIAC) PF 100 MG/5ML IV SOLN
100 MG/5ML | INTRAVENOUS | Status: DC | PRN
Start: 2022-04-18 — End: 2022-04-18
  Administered 2022-04-18: 17:00:00 40 via INTRAVENOUS

## 2022-04-18 MED ORDER — FENTANYL CITRATE (PF) 100 MCG/2ML IJ SOLN
100 MCG/2ML | INTRAMUSCULAR | Status: DC | PRN
Start: 2022-04-18 — End: 2022-04-18

## 2022-04-18 MED ORDER — STERILE WATER FOR INJECTION (MIXTURES ONLY)
2 g | Status: AC
Start: 2022-04-18 — End: 2022-04-18
  Administered 2022-04-18: 17:00:00 2000 mg via INTRAVENOUS

## 2022-04-18 MED ORDER — SODIUM CHLORIDE 0.9 % IV SOLN
0.9 % | INTRAVENOUS | Status: DC | PRN
Start: 2022-04-18 — End: 2022-04-18
  Administered 2022-04-18: 18:00:00 60.15 via INTRAMUSCULAR

## 2022-04-18 MED ORDER — MIDAZOLAM HCL (PF) 2 MG/2ML IJ SOLN
2 MG/ML | Freq: Once | INTRAMUSCULAR | Status: DC | PRN
Start: 2022-04-18 — End: 2022-04-18

## 2022-04-18 MED ORDER — NORMAL SALINE FLUSH 0.9 % IV SOLN
0.9 % | INTRAVENOUS | Status: DC | PRN
Start: 2022-04-18 — End: 2022-04-18

## 2022-04-18 MED ORDER — CEPHALEXIN 500 MG PO CAPS
500 MG | ORAL_CAPSULE | Freq: Three times a day (TID) | ORAL | 0 refills | Status: AC
Start: 2022-04-18 — End: 2022-04-19

## 2022-04-18 MED FILL — PROPOFOL 200 MG/20ML IV EMUL: 200 MG/20ML | INTRAVENOUS | Qty: 20

## 2022-04-18 MED FILL — LACTATED RINGERS IV SOLN: INTRAVENOUS | Qty: 1000

## 2022-04-18 MED FILL — BD POSIFLUSH 0.9 % IV SOLN: 0.9 % | INTRAVENOUS | Qty: 40

## 2022-04-18 MED FILL — ONDANSETRON HCL 4 MG/2ML IJ SOLN: 4 MG/2ML | INTRAMUSCULAR | Qty: 2

## 2022-04-18 MED FILL — ESMOLOL HCL 100 MG/10ML IV SOLN: 100 MG/10ML | INTRAVENOUS | Qty: 10

## 2022-04-18 MED FILL — EPINEPHRINE PF 1 MG/ML IJ SOLN: 1 MG/ML | INTRAMUSCULAR | Qty: 1

## 2022-04-18 MED FILL — MIDAZOLAM HCL 2 MG/2ML IJ SOLN: 2 MG/ML | INTRAMUSCULAR | Qty: 4

## 2022-04-18 MED FILL — LIDOCAINE HCL (CARDIAC) PF 100 MG/5ML IV SOLN: 100 MG/5ML | INTRAVENOUS | Qty: 5

## 2022-04-18 MED FILL — SENSORCAINE-MPF 0.5 % IJ SOLN: 0.5 % | INTRAMUSCULAR | Qty: 30

## 2022-04-18 MED FILL — DEXAMETHASONE SOD PHOSPHATE PF 10 MG/ML IJ SOLN: 10 MG/ML | INTRAMUSCULAR | Qty: 1

## 2022-04-18 MED FILL — CEFAZOLIN SODIUM 2 G IV SOLR: 2 g | INTRAVENOUS | Qty: 2000

## 2022-04-18 NOTE — Other (Signed)
Reviewed PTA medication list with patient/caregiver and patient/caregiver denies any additional medications.     Patient admits to having a responsible adult care for them at home for at least 24 hours after surgery.    Patient encouraged to use gown warming system and informed that using said warming gown to regulate body temperature prior to a procedure has been shown to help reduce the risks of blood clots and infection.    Patient's pharmacy of choice verified and documented in PTA medication section.    Dual skin assessment & fall risk band verification completed with K Hollinger RN.

## 2022-04-18 NOTE — Anesthesia Pre-Procedure Evaluation (Signed)
Department of Anesthesiology  Preprocedure Note       Name:  Michael Galvan   Age:  71 y.o.  DOB:  1951-02-07                                          MRN:  [id number]         Date:  04/18/2022      Surgeon: Moishe Spice):  Luna Glasgow, MD    Procedure: Procedure(s):  LEFT SHOULDER ARTHROSCOPIC DECOMPRESSION, RESECTION DISTAL CLAVICLE ROTATOR CUFF REPAIR, LONG HEAD BICEPS TENODESIS    Medications prior to admission:   Prior to Admission medications    Medication Sig Start Date End Date Taking? Authorizing Provider   ibuprofen (ADVIL;MOTRIN) 800 MG tablet Take 1 tablet by mouth every 6 hours as needed for Pain    [provider]   albuterol sulfate HFA (PROVENTIL;VENTOLIN;PROAIR) 108 (90 Base) MCG/ACT inhaler Inhale 1 puff into the lungs every 6 hours as needed 09/21/19   Automatic Reconciliation, Ar   atorvastatin (LIPITOR) 20 MG tablet Take 1 tablet by mouth nightly 02/25/20   Automatic Reconciliation, Ar   FLUoxetine (PROZAC) 10 MG capsule Take 1 capsule by mouth daily 02/25/20   Automatic Reconciliation, Ar   irbesartan-hydroCHLOROthiazide (AVALIDE) 150-12.5 MG per tablet Take 1 tablet by mouth daily 02/25/20   Automatic Reconciliation, Ar   metFORMIN (GLUCOPHAGE-XR) 500 MG extended release tablet Take 2 tablets by mouth daily 03/08/20   Automatic Reconciliation, Ar       Current medications:    Current Facility-Administered Medications   Medication Dose Route Frequency Provider Last Rate Last Admin    ceFAZolin (ANCEF) 2,000 mg in sterile water 20 mL IV syringe  2,000 mg IntraVENous On Call to OR Christeen Douglas III, MD        lactated ringers IV soln infusion   IntraVENous Continuous Christeen Douglas III, MD 125 mL/hr at 04/18/22 1103 NoRateChange at 04/18/22 1103       Allergies:  No Known Allergies    Problem List:    Patient Active Problem List   Diagnosis Code    Pure hypercholesterolemia E78.00    Elevated PSA R97.20    Excessive daytime sleepiness G47.19    Obstructive sleep apnea syndrome G47.33     Type 2 diabetes mellitus without complication, without long-term current use of insulin (HCC) E11.9    Anxiety F41.9    Hypertension I10    Incomplete tear of left rotator cuff M75.112       Past Medical History:        Diagnosis Date    Basal cell carcinoma     removal of BCC neck and left shoulder    Diabetes mellitus (HCC)     High cholesterol     Hypertension     Sleep apnea     Patient instructed to bring on DOS       Past Surgical History:        Procedure Laterality Date    HERNIA REPAIR      umbilical x 2  1990's    KNEE ARTHROSCOPY W/ ACL RECONSTRUCTION Right     x 2  2000's    KNEE ARTHROSCOPY W/ ACL RECONSTRUCTION Left     2000's       Social History:    Social History     Tobacco Use  Smoking status: Former     Types: Cigarettes     Quit date: 1979     Years since quitting: 44.9    Smokeless tobacco: Never   Substance Use Topics    Alcohol use: Yes     Comment: 2 glasses of wine or mixed drink monthly                                Counseling given: Not Answered      Vital Signs (Current):   Vitals:    04/18/22 0959   BP: (!) 149/86   Pulse: 67   Resp: 16   Temp: 97 F (36.1 C)   TempSrc: Temporal   SpO2: 99%   Weight: 116.8 kg (257 lb 8 oz)   Height: 1.88 m (6\' 2" )                                              BP Readings from Last 3 Encounters:   04/18/22 (!) 149/86       NPO Status: Time of last liquid consumption: 2100                        Time of last solid consumption: 1600                        Date of last liquid consumption: 04/17/22                        Date of last solid food consumption: 04/17/22    BMI:   Wt Readings from Last 3 Encounters:   04/18/22 116.8 kg (257 lb 8 oz)   04/08/22 114.8 kg (253 lb)   04/25/20 118.4 kg (261 lb)     Body mass index is 33.06 kg/m.    CBC:   Lab Results   Component Value Date/Time    WBC 8.5 02/25/2022 12:52 PM    RBC 4.57 02/25/2022 12:52 PM    HGB 13.6 02/25/2022 12:52 PM    HCT 42.7 02/25/2022 12:52 PM    MCV 93.4 02/25/2022 12:52 PM    RDW 14.1  02/25/2022 12:52 PM    PLT 197 02/25/2022 12:52 PM       CMP:   Lab Results   Component Value Date/Time    NA 135 02/25/2022 12:52 PM    K 3.9 02/25/2022 12:52 PM    CL 101 02/25/2022 12:52 PM    CO2 27 02/25/2022 12:52 PM    BUN 14 02/25/2022 12:52 PM    CREATININE 0.74 02/25/2022 12:52 PM    LABGLOM >60 02/25/2022 12:52 PM    GLUCOSE 108 02/25/2022 12:52 PM    PROT 7.6 02/25/2022 12:52 PM    CALCIUM 9.0 02/25/2022 12:52 PM    BILITOT 0.8 02/25/2022 12:52 PM    ALKPHOS 82 02/25/2022 12:52 PM    AST 33 02/25/2022 12:52 PM    ALT 50 02/25/2022 12:52 PM       POC Tests:   Recent Labs     04/18/22  1037   POCGLU 115*       Coags: No results found for: "PROTIME", "INR", "APTT"    HCG (If Applicable): No results found for: "PREGTESTUR", "PREGSERUM", "HCG", "  HCGQUANT"     ABGs: No results found for: "PHART", "PO2ART", "PCO2ART", "HCO3ART", "BEART", "O2SATART"     Type & Screen (If Applicable):  No results found for: "LABABO", "LABRH"    Drug/Infectious Status (If Applicable):  No results found for: "HIV", "HEPCAB"    COVID-19 Screening (If Applicable): No results found for: "COVID19"        Anesthesia Evaluation  Patient summary reviewed and Nursing notes reviewed  Airway: Mallampati: II          Dental:    (+) caps and upper braces      Pulmonary:normal exam    (+)     sleep apnea: on CPAP,       asthma:                            Cardiovascular:    (+) hypertension: no interval change        Rhythm: regular  Rate: normal                    Neuro/Psych:   Negative Neuro/Psych ROS              GI/Hepatic/Renal: Neg GI/Hepatic/Renal ROS            Endo/Other:    (+) Diabetesno interval change.                 Abdominal:             Vascular: negative vascular ROS.         Other Findings:             Anesthesia Plan      general and regional     ASA 3       Induction: intravenous.      Anesthetic plan and risks discussed with patient and spouse.      Plan discussed with CRNA.    Attending anesthesiologist reviewed and  agrees with Preprocedure content            R/B ISB discussed including nerve injury.    Krystal Clark, MD   04/18/2022

## 2022-04-18 NOTE — Op Note (Signed)
Patient: Michael Galvan MRN: [id number]  CSN: [id number]   Date of Birth: 09/14/1950  Age: 71 y.o.  Sex: male      Date of Surgery: 04/18/2022     PREOPERATIVE DIAGNOSES  1. Left shoulder subacromial bursitis/impingement syndrome.  2. 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  3. Left shoulder complete rotator cuff tear.      POSTOPERATIVE DIAGNOSES:  1. Left shoulder subacromial bursitis/impingement syndrome.  2. 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  3. Left shoulder complete rotator cuff tear.  4. Long head of the biceps tearing(50%)    PROCEDURES PERFORMED  1. Surgical arthroscopy, left shoulder, with arthroscopic subacromial  Decompression/bursectomy with extensive debridement.  2. Left shoulder arthroscopic distal clavicle excision.  3. Left shoulder arthroscopic rotator cuff repair.  4. Left shoulder suprascapular nerve block by Dr. Bing Plume.  5. Long head of the biceps tenodesis      IMPLANTS:   Implant Name Type Inv. Item Serial No. Manufacturer Lot No. LRB No. Used Action   ANCHOR SUT DIA5. KNOTLESS Eddie [employer]le - [id number]  ANCHOR SUT DIA5. KNOTLESS Sheliah [employer]atch [employer]-WD [id number] Left 1 Implanted       SURGEON: Luna Glasgow, MD    FIRST ASSISTANT: Larena Sox, PA-C    SECOND ASSISTANT: Scrub Person First: Fenton Foy, RN  Scrub Person Second: Hilma Favors  Physician Assistant: Truett Perna, PA-C    ANESTHESIA: General.    COMPLICATIONS: None.    ESTIMATED BLOOD LOSS: Minimal.    FINDINGS: Same    SPECIMENS REMOVED: none    INDICATIONS: A 71 y.o.  White (non-Hispanic)  male with known left  shoulder bursitis, AC DJD and rotator cuff tear as seen by MRI. The patient has failed all conservative interventions including medications, physical therapy, relative rest  and injections.     DESCRIPTION OF PROCEDURE: The patient was brought to the operating theater  and after adequate general anesthesia,  the patient was put on the OR table and  underwent general anesthesia and put in the beach chair position. The left  shoulder was then examined and then prepped and draped in the typical  sterile fashion. The patient had full range of motion with no instability.  Standard anterior, posterior, and midlateral portals were all anesthetized  with 0.25% Marcaine with 1:200,000 epinephrine. The glenohumeral joint was  instilled with 15 mL of the same mixture. The subacromial space was then injected with the same  Mixture/amount. The spinal needle was placed from superior into the spinoglenoid notch.  Approximately 10cc's of the Marcaine mixture was placed in this are effectively blocking the suprascapular nerve. The scope was placed posteriorly into the glenohumeral joint and through the rotator interval, an anterior portal was created and a small metal cannula inserted over the Wissinger rod. Findings at this time showed  normal articular surfaces.  There was a supraspinatus complete rotator cuff  tear that was approximately 2 cm in width and 1 cm retracted. The subscap appeared normal.   The biceps anchor, as well as the intra and extra articular biceps was 50% torn and released..  The  scope was withdrawn and placed in the subacromial space. A midlateral  portal was created and the gray cannula inserted. The soft tissue on the  underside of the acromion was then resected with the Incisor Plus blade and the soft tissue/bone was extensively debrided and then using the Helicut bur a lateral and posterior trough were created outlining  the decompression area. The bur was then placed posteriorly and the scope in the midlateral portal, and a type 1 acromion was created by the cutting block method, removing about 13 mm of anterior acromion. The scope was returned to the posterior portal and the bur in the midlateral portal, and the inferior distal 1.0 cm clavicle was  resected. The bur was then placed anteriorly, and the  remaining superior  distal 1.0 cm clavicle was resected. The large-bore threaded cannula was  then placed in the midlateral portal, and the rotator cuff footprint was  denuded with Incisor Plus / Helicut bur down to good bleeding surface.  Small holes were made in the rotator cuff footprint for marrow venting.   A #2 Ultratape was loaded on the First Pass and passed through a full-thickness bite of the posterior part of the torn rotator cuff.   The second lead of the Ultratape was placed in the suture passer and passed just 1 cm from the first pass and brought out the midlateral portal.   The 5.5 Multifix by Tamala Julian [employer] Nephew was used and the two leads of #2 Ultratape and the biceps ultrabraid stitches were passed through the anchor and the awl was used to make the hole at the appropriate place on the upper aspect of the greater tuberosity.   The anchor was then deployed into the hole keeping the stitches taught. This brought the rotator cuff & biceps in good apposition.   There was good repair of the rotator cuff to its anatomic   position with the arm at the side and there was no stress of the repair.  The space was then decompressed. Each of the portals were closed with 1  horizontal 4-0 Monocryl. They were steri-stripped, had 4 x 4's placed, and  tape. The patient was put in a sling and returned to the recovery room  awake, in stable condition. All instrument, sponge and needle counts were  Correct. The biceps was whipstitched and the extra 2 cm of biceps resected.     A physician assistant was used during the surgery which contributes to better patient outcomes by decreasing operating room time and decreasing the amount of anesthesia the patient had to undergo.  The physician assistant helped with positioning and draping of the patient, retraction of soft tissues as necessary so as not to traumatize the tissues.  During several parts of the procedure the PA used the arthroscope to help with visualization while I  performed the actual surgery.  The PA also used the shaver under my direction with either the Helicut burr or the suction/shaver attachment to help complete the procedure.  During the repair the camera was held by the PA as well as alternating with holding the arm in the correct position so the suture anchors and stitches could be performed by me.   The physician assistant instilled local anesthetic which decreased the need for post operative narcotics.   During closure the physician assistant was able to close the portals with an inverted 3-0 Monocryl ensuring a water tight closure and decreasing the risk of deep infection.   The steristrips and the dressing were applied by the PA to ensure a tight closure.  This helps contribute to a lower risk of   infection.      Tommy Medal III, MD  04/18/2022

## 2022-04-18 NOTE — Other (Signed)
Discharge instructions given to patient and caregiver. Written instructions given to take home. Verbal understanding given by patient and caregiver. ID band removed before leaving.

## 2022-04-18 NOTE — Interval H&P Note (Signed)
Update History & Physical    The patient's History and Physical of December 4, the surgery was reviewed with the patient and I examined the patient. There was no change. The surgical site was confirmed by the patient and me.     Plan: The risks, benefits, expected outcome, and alternative to the recommended procedure have been discussed with the patient. Patient understands and wants to proceed with the procedure.     Electronically signed by Onnie Boer, MD on 04/18/2022 at 11:24 AM

## 2022-04-18 NOTE — Anesthesia Procedure Notes (Signed)
Peripheral Block    Patient location during procedure: pre-op  Reason for block: procedure for pain, post-op pain management, primary anesthetic and at surgeon's request  Start time: 04/18/2022 11:11 AM  End time: 04/18/2022 11:20 AM  Staffing  Performed: anesthesiologist   Anesthesiologist: Myrna Blazer, MD  Performed by: Myrna Blazer, MD  Authorized by: Myrna Blazer, MD    Preanesthetic Checklist  Completed: patient identified, IV checked, site marked, risks and benefits discussed, surgical/procedural consents, equipment checked, pre-op evaluation, timeout performed, anesthesia consent given, oxygen available, monitors applied/VS acknowledged, fire risk safety assessment completed and verbalized and blood product R/B/A discussed and consented  Peripheral Block   Patient position: supine  Prep: ChloraPrep  Provider prep: mask and sterile gloves  Patient monitoring: cardiac monitor, continuous pulse ox, frequent blood pressure checks, IV access, oxygen and responsive to questions  Block type: Brachial plexus  Interscalene  Laterality: left  Injection technique: single-shot  Guidance: nerve stimulator and ultrasound guided    Needle   Needle type: insulated echogenic nerve stimulator needle   Needle gauge: 21 G  Needle localization: nerve stimulator and ultrasound guidance  Needle insertion depth: 2 cm  Needle length: 5 cm  Assessment   Injection assessment: negative aspiration for heme, no paresthesia on injection, local visualized surrounding nerve on ultrasound and no intravascular symptoms  Paresthesia pain: none  Slow fractionated injection: yes  Hemodynamics: stable  Real-time [place] image taken/store: yes  Outcomes: uncomplicated and patient tolerated procedure well    Medications Administered  dexmedetomidine (PRECEDEX) injection 200 mcg/2 mL - Perineural   0.02 mL - 04/18/2022 11:20:00 AM  dexamethasone (DECADRON) (PF) 10 mg/mL injection - Other   4 mg - 04/18/2022 11:20:00 AM  mepivacaine (CARBOCAINE) injection  1.5% - Perineural   5 mL - 04/18/2022 11:20:00 AM  ropivacaine (NAROPIN) injection 0.5% - Perineural   20 mL - 04/18/2022 11:20:00 AM

## 2022-04-19 NOTE — Anesthesia Post-Procedure Evaluation (Signed)
Post-Anesthesia Evaluation & Assessment    Visit Vitals  BP 136/87   Pulse 65   Temp 98.1 F (36.7 C) (Temporal)   Resp 16   Ht 1.88 m (6\' 2" )   Wt 116.8 kg (257 lb 8 oz)   SpO2 99%   BMI 33.06 kg/m       Nausea/Vomiting: no nausea    Post-operative hydration adequate.    Pain score (VAS): 0    Mental status & Level of consciousness: alert and oriented x 3    Neurological status: moves all extremities, sensation grossly intact    Pulmonary status: airway patent, no supplemental oxygen required    Complications related to anesthesia: none    Additional comments:

## 2022-05-24 ENCOUNTER — Encounter

## 2022-05-24 ENCOUNTER — Inpatient Hospital Stay: Admit: 2022-05-24 | Discharge: 2022-05-31 | Payer: MEDICARE | Primary: Internal Medicine

## 2022-05-24 DIAGNOSIS — G5602 Carpal tunnel syndrome, left upper limb: Secondary | ICD-10-CM

## 2022-05-24 DIAGNOSIS — Z01818 Encounter for other preprocedural examination: Secondary | ICD-10-CM

## 2022-05-24 LAB — COMPREHENSIVE METABOLIC PANEL
ALT: 44 U/L (ref 16–61)
AST: 28 U/L (ref 10–38)
Albumin/Globulin Ratio: 1.1 (ref 0.8–1.7)
Albumin: 3.9 g/dL (ref 3.4–5.0)
Alk Phosphatase: 79 U/L (ref 45–117)
Anion Gap: 5 mmol/L (ref 3.0–18)
BUN: 15 MG/DL (ref 7.0–18)
Bun/Cre Ratio: 18 (ref 12–20)
CO2: 28 mmol/L (ref 21–32)
Calcium: 9.1 MG/DL (ref 8.5–10.1)
Chloride: 109 mmol/L (ref 100–111)
Creatinine: 0.85 MG/DL (ref 0.6–1.3)
Est, Glom Filt Rate: >60 mL/min/{1.73_m2} (ref >60)
Globulin: 3.5 g/dL (ref 2.0–4.0)
Glucose: 106 mg/dL — ABNORMAL HIGH (ref 74–99)
Potassium: 4.1 mmol/L (ref 3.5–5.5)
Sodium: 142 mmol/L (ref 136–145)
Total Bilirubin: 0.5 MG/DL (ref 0.2–1.0)
Total Protein: 7.4 g/dL (ref 6.4–8.2)

## 2022-05-24 LAB — CBC WITH AUTO DIFFERENTIAL
Absolute Immature Granulocyte: 0 10*3/uL (ref 0.00–0.04)
Basophils %: 1 % (ref 0–2)
Basophils Absolute: 0.1 10*3/uL (ref 0.0–0.1)
Eosinophils %: 2 % (ref 0–5)
Eosinophils Absolute: 0.2 10*3/uL (ref 0.0–0.4)
Hematocrit: 43.2 % (ref 36.0–48.0)
Hemoglobin: 14.2 g/dL (ref 13.0–16.0)
Immature Granulocytes: 0 % (ref 0.0–0.5)
Lymphocytes %: 26 % (ref 21–52)
Lymphocytes Absolute: 2.1 10*3/uL (ref 0.9–3.6)
MCH: 30.3 PG (ref 24.0–34.0)
MCHC: 32.9 g/dL (ref 31.0–37.0)
MCV: 92.1 FL (ref 78.0–100.0)
MPV: 10.3 FL (ref 9.2–11.8)
Monocytes %: 11 % — ABNORMAL HIGH (ref 3–10)
Monocytes Absolute: 0.9 10*3/uL (ref 0.05–1.2)
Neutrophils %: 60 % (ref 40–73)
Neutrophils Absolute: 4.9 10*3/uL (ref 1.8–8.0)
Nucleated RBCs: 0 PER 100 WBC
Platelets: 182 10*3/uL (ref 135–420)
RBC: 4.69 M/uL (ref 4.35–5.65)
RDW: 13.4 % (ref 11.6–14.5)
WBC: 8.2 10*3/uL (ref 4.6–13.2)
nRBC: 0 10*3/uL (ref 0.00–0.01)

## 2022-05-24 LAB — HEMOGLOBIN A1C
Estimated Avg Glucose: 146 mg/dL
Hemoglobin A1C: 6.7 % — ABNORMAL HIGH (ref 4.2–5.6)

## 2022-05-27 ENCOUNTER — Inpatient Hospital Stay: Payer: MEDICARE | Primary: Internal Medicine

## 2022-05-27 LAB — EKG 12-LEAD
Atrial Rate: 74 {beats}/min
Diagnosis: NORMAL
P Axis: 46 degrees
P-R Interval: 168 ms
Q-T Interval: 392 ms
QRS Duration: 94 ms
QTc Calculation (Bazett): 435 ms
R Axis: 15 degrees
T Axis: 38 degrees
Ventricular Rate: 74 {beats}/min

## 2022-05-27 NOTE — Other (Signed)
PAT - SURGICAL PRE-ADMISSION INSTRUCTIONS    NAME:  Michael Galvan                                                          TODAY'S DATE:  05/27/2022                                SURGERY   ARRIVAL   TIME:   tba    Do NOT eat or drink anything, including candy or gum, after MIDNIGHT on 05/29/22 , unless you have specific instructions from your Surgeon or Anesthesia Provider to do so.  No smoking 24 hours before surgery.  No alcohol 24 hours prior to the day of surgery.  No recreational drugs for one week prior to the day of surgery.  Leave all valuables, including money/purse, at home.  Remove all jewelry, nail polish, makeup (including mascara); no lotions, powders, deodorant, or perfume/cologne/after shave.  Glasses/Contact lenses and Dentures may be worn to the hospital.  They will be removed prior to surgery.  Call your doctor if symptoms of a cold or illness develop within 24 ours prior to surgery.  AN ADULT MUST DRIVE YOU HOME AFTER OUTPATIENT SURGERY.   If you are having an OUTPATIENT procedure, please make arrangements for a responsible adult to be with you for 24 hours after your surgery.  If you are admitted to the hospital, you will be assigned to a bed after surgery is complete.  Normally a family member will not be able to see you until you are in your assigned bed.  12. Visitation Policy Explained    Special Instructions:  Take these medications the morning of surgery with a sip of water:  Prozac, HOLD oral diabetic medication on the MORNING OF surgery., and HOLD metformin/glucophage dose starting the EVENING BEFORE the day of surgery.  Follow all verbal or written instructions given by your surgeon's office.  BRING PICTURE ID, INSURANCE CARD  & COVID CARD ON DOS  Patient Prep:    use CHG solution     These surgical instructions were reviewed with patient during the PAT phone call.    PAT Activity Status Questionnaire       Yes No Points for YES    Are you able to climb a flight of stairs or walk up a  hill? [x]  []  1   Are you able to do heavy work around the house like lifting or moving heavy furniture? [x]  []  1   Do yardwork like raking leaves, weeding or pushing a power mower? [x]  []  1   Participate in strenuous sports like swimming, singles tennis, football, basketball or skiing? []  [x]  1   Total Score: 3       If TOTAL SCORE is <3, send chart for anesthesia review.

## 2022-05-28 NOTE — H&P (Cosign Needed)
History and Physical        Patient: Michael Galvan               Sex: male          DOA: (Not on file)         Date of Birth:  02-16-1951      Age:  72 y.o.        LOS:  LOS: 0 days        HPI:     Toddy is in for his left shoulder ASD/ADCE/ARCR/LHB tenodesis from 04/18/22,  left EMG-documented moderately severe carpal tunnel syndrome/left third trigger finger, and bilateral moderate medial tibiofemoral/patellofemoral DJD/status post ACL reconstruction by Dr. Sydnee Cabal with retained metallic screws. The patient is in for followup. His shoulder is doing better. He is still doing formalized physical therapy. At this point he is ready for endoscopic carpal tunnel release.  His motion is improved. He is still getting numbness and tingling into the hand and has active locking and triggering of the third digit.      Past Medical History:   Diagnosis Date    Basal cell carcinoma     removal of BCC neck and left shoulder    Diabetes mellitus (HCC)     High cholesterol     Hypertension     Sleep apnea     Patient instructed to bring on DOS       Past Surgical History:   Procedure Laterality Date    HERNIA REPAIR      umbilical x 2  1990's    KNEE ARTHROSCOPY W/ ACL RECONSTRUCTION Right     x 2  2000's    KNEE ARTHROSCOPY W/ ACL RECONSTRUCTION Left     2000's    SHOULDER ARTHROSCOPY Left 04/18/2022    LEFT SHOULDER ARTHROSCOPIC DECOMPRESSION, RESECTION DISTAL CLAVICLE ROTATOR CUFF REPAIR, LONG HEAD BICEPS TENODESIS performed by Luna Glasgow, MD at [facility] MAIN OR       No family history on file.    Social History     Socioeconomic History    Marital status: Married   Tobacco Use    Smoking status: Former     Current packs/day: 0.00     Types: Cigarettes     Quit date: 1979     Years since quitting: 45.0    Smokeless tobacco: Never   Vaping Use    Vaping Use: Never used   Substance and Sexual Activity    Alcohol use: Yes     Comment: 2 glasses of wine or mixed drink monthly    Drug use: Never    Sexual activity: Yes        Prior to Admission medications    Medication Sig Start Date End Date Taking? Authorizing Provider   ibuprofen (ADVIL;MOTRIN) 800 MG tablet Take 1 tablet by mouth every 6 hours as needed for Pain    [provider]   albuterol sulfate HFA (PROVENTIL;VENTOLIN;PROAIR) 108 (90 Base) MCG/ACT inhaler Inhale 1 puff into the lungs every 6 hours as needed 09/21/19   Automatic Reconciliation, Ar   atorvastatin (LIPITOR) 20 MG tablet Take 1 tablet by mouth nightly 02/25/20   Automatic Reconciliation, Ar   FLUoxetine (PROZAC) 10 MG capsule Take 1 capsule by mouth daily 02/25/20   Automatic Reconciliation, Ar   irbesartan-hydroCHLOROthiazide (AVALIDE) 150-12.5 MG per tablet Take 1 tablet by mouth daily 02/25/20   Automatic Reconciliation, Ar   metFORMIN (GLUCOPHAGE-XR) 500 MG extended release tablet  Take 2 tablets by mouth daily 03/08/20   Automatic Reconciliation, Ar       No Known Allergies    Review of Systems  GENERAL:  Patient has no signs of fever, chills or weight change.  HEAD/ENTM:  Patient has no signs of headaches, dizziness, hearing loss, ringing in ears, sore throat/hoarseness, recent cold, double vision, blurred vision, itchy eyes, eye redness or eye discharge.  CARDIOVASCULAR:  Patient has no signs of chest pain, palpitations, rheumatic fever or heart murmur.  RESPIRATORY:  Patient has no signs of chronic cough, wheezing, difficulty breathing, pain on breathing or shortness of breath.  GASTROINTESTINAL:  Patient has no signs of nausea/vomiting, difficulty swallowing, gas/bloating, indigestion, abdominal pain, diarrhea, bloody stools or hemorrhoids.  GENITOURINARY:  Patient has no signs of blood in urine, painful urinating, burning sensation, bladder/kidney infection, frequent urinating or incontinence.  MUSCULOSKELETAL: Patient presents with joint stiffness. Patient has no signs of fracture/dislocation, sprain/strain, tendonitis, joint pain, rheumatoid disease, gout or swelling in  feet.  INTEGUMENTARY:  Patient has no signs of rash/itching, psoriasis, Raynaud's phenomenon or varicose veins.  EMOTIONAL:  Patient has no signs of anxiety, depression, bipolar disorder, memory loss or change in mood.      Physical Exam:      There were no vitals taken for this visit.    Physical Exam:  Left hand shows a positive Tinel's and tenderness to palpation of the third A1 pulley. Physical examination shows that the patient's left hand demonstrates no obvious swelling, ecchymosis, or wounds noted. The patient has normal motion in all six directions.  The patient has a negative Phalen and direct carpal compression tests.  The patient has a negative Finkelstein maneuver.  The patient has good capillary refill.  The patient has good grip strength of the hand with normal thenar eminence tone and normal light-touch sensation at the tip of each digit.          Assessment/Plan     Principal Problem:    Carpal tunnel syndrome of left wrist  Resolved Problems:    * No resolved hospital problems. *      The patient is going to be scheduled for a left endoscopic carpal tunnel release and left 3rd trigger finger injection/possible trigger finger release.  The risks associated with the procedure including pain, bleeding, myotendinous injury, neurovascular compromise, infection, need for future surgery were reviewed  and all questions were answered.  The patient understands these risks and is willing to proceed.  The patient was issued a  pill prescription for Norco for operative pain management  and the patient will follow up 10 days postoperatively.  The importance of ice and elevation were reviewed in detail as well as gentle exercise for digital mobility.  We will plan injection of the trigger finger, but if he wishes to have release, we can change this at any point in time and get him scheduled accordingly. He will continue physical therapy for his left shoulder.

## 2022-05-30 ENCOUNTER — Inpatient Hospital Stay: Payer: MEDICARE | Attending: Orthopaedic Surgery

## 2022-05-30 LAB — POCT GLUCOSE
POC Glucose: 127 mg/dL — ABNORMAL HIGH (ref 70–110)
POC Glucose: 118 mg/dL — ABNORMAL HIGH (ref 70–110)

## 2022-05-30 MED ORDER — NORMAL SALINE FLUSH 0.9 % IV SOLN
0.9 % | Freq: Two times a day (BID) | INTRAVENOUS | Status: DC
Start: 2022-05-30 — End: 2022-05-30

## 2022-05-30 MED ORDER — ACETAMINOPHEN 325 MG PO TABS
325 MG | Freq: Once | ORAL | Status: DC | PRN
Start: 2022-05-30 — End: 2022-05-30

## 2022-05-30 MED ORDER — MEPERIDINE HCL 50 MG/ML IJ SOLN
50 MG/ML | INTRAMUSCULAR | Status: DC | PRN
Start: 2022-05-30 — End: 2022-05-30

## 2022-05-30 MED ORDER — LACTATED RINGERS IV SOLN
INTRAVENOUS | Status: DC
Start: 2022-05-30 — End: 2022-05-30

## 2022-05-30 MED ORDER — LABETALOL HCL 5 MG/ML IV SOLN
5 MG/ML | INTRAVENOUS | Status: DC | PRN
Start: 2022-05-30 — End: 2022-05-30

## 2022-05-30 MED ORDER — ONDANSETRON HCL 4 MG/2ML IJ SOLN
4 MG/2ML | Freq: Once | INTRAMUSCULAR | Status: DC | PRN
Start: 2022-05-30 — End: 2022-05-30

## 2022-05-30 MED ORDER — KETOROLAC TROMETHAMINE 15 MG/ML IJ SOLN
15 MG/ML | INTRAMUSCULAR | Status: DC | PRN
Start: 2022-05-30 — End: 2022-05-30
  Administered 2022-05-30: 19:00:00 30 via INTRAVENOUS

## 2022-05-30 MED ORDER — NORMAL SALINE FLUSH 0.9 % IV SOLN
0.9 % | INTRAVENOUS | Status: DC | PRN
Start: 2022-05-30 — End: 2022-05-30

## 2022-05-30 MED ORDER — SODIUM CHLORIDE 0.9 % IV SOLN
0.9 % | INTRAVENOUS | Status: DC | PRN
Start: 2022-05-30 — End: 2022-05-30

## 2022-05-30 MED ORDER — LIDOCAINE HCL (PF) 2 % IJ SOLN
2 % | INTRAMUSCULAR | Status: DC | PRN
Start: 2022-05-30 — End: 2022-05-30
  Administered 2022-05-30: 18:00:00 80 via INTRAVENOUS

## 2022-05-30 MED ORDER — HYDROMORPHONE HCL PF 1 MG/ML IJ SOLN
1 MG/ML | INTRAMUSCULAR | Status: DC | PRN
Start: 2022-05-30 — End: 2022-05-30

## 2022-05-30 MED ORDER — DEXAMETHASONE 4 MG/ML IJ SOLN (MIXTURES ONLY)
4 MG/ML | INTRAMUSCULAR | Status: DC | PRN
Start: 2022-05-30 — End: 2022-05-30
  Administered 2022-05-30: 18:00:00 4 via INTRAVENOUS

## 2022-05-30 MED ORDER — DIPHENHYDRAMINE HCL 50 MG/ML IJ SOLN
50 MG/ML | Freq: Once | INTRAMUSCULAR | Status: DC | PRN
Start: 2022-05-30 — End: 2022-05-30

## 2022-05-30 MED ORDER — FENTANYL CITRATE PF 50 MCG/ML IJ SOSY
50 MCG/ML | INTRAMUSCULAR | Status: DC | PRN
Start: 2022-05-30 — End: 2022-05-30
  Administered 2022-05-30: 19:00:00 25 via INTRAVENOUS
  Administered 2022-05-30: 18:00:00 50 via INTRAVENOUS
  Administered 2022-05-30: 19:00:00 25 via INTRAVENOUS

## 2022-05-30 MED ORDER — LACTATED RINGERS IV SOLN
INTRAVENOUS | Status: DC
Start: 2022-05-30 — End: 2022-05-30
  Administered 2022-05-30 (×2): via INTRAVENOUS

## 2022-05-30 MED ORDER — PROPOFOL 200 MG/20ML IV EMUL
200 MG/20ML | INTRAVENOUS | Status: DC | PRN
Start: 2022-05-30 — End: 2022-05-30
  Administered 2022-05-30: 18:00:00 100 via INTRAVENOUS
  Administered 2022-05-30: 18:00:00 200 via INTRAVENOUS

## 2022-05-30 MED ORDER — MIDAZOLAM HCL (PF) 2 MG/2ML IJ SOLN
2 MG/ML | Freq: Once | INTRAMUSCULAR | Status: DC | PRN
Start: 2022-05-30 — End: 2022-05-30

## 2022-05-30 MED ORDER — ONDANSETRON HCL 4 MG/2ML IJ SOLN
4 MG/2ML | INTRAMUSCULAR | Status: DC | PRN
Start: 2022-05-30 — End: 2022-05-30
  Administered 2022-05-30: 19:00:00 4 via INTRAVENOUS

## 2022-05-30 MED ORDER — FENTANYL CITRATE (PF) 100 MCG/2ML IJ SOLN
100 MCG/2ML | INTRAMUSCULAR | Status: DC | PRN
Start: 2022-05-30 — End: 2022-05-30

## 2022-05-30 MED ORDER — BUPIVACAINE HCL (PF) 0.25 % IJ SOLN
0.25 % | INTRAMUSCULAR | Status: DC | PRN
Start: 2022-05-30 — End: 2022-05-30
  Administered 2022-05-30: 19:00:00 10 via INTRADERMAL

## 2022-05-30 MED ORDER — METHYLPREDNISOLONE ACETATE 40 MG/ML IJ SUSP
40 MG/ML | INTRAMUSCULAR | Status: DC | PRN
Start: 2022-05-30 — End: 2022-05-30
  Administered 2022-05-30: 19:00:00 40 via INTRAMUSCULAR

## 2022-05-30 MED ORDER — MIDAZOLAM HCL 2 MG/2ML IJ SOLN
2 MG/ML | INTRAMUSCULAR | Status: DC | PRN
Start: 2022-05-30 — End: 2022-05-30
  Administered 2022-05-30: 18:00:00 2 via INTRAVENOUS

## 2022-05-30 MED FILL — LACTATED RINGERS IV SOLN: INTRAVENOUS | Qty: 1000

## 2022-05-30 MED FILL — BD POSIFLUSH 0.9 % IV SOLN: 0.9 % | INTRAVENOUS | Qty: 40

## 2022-05-30 NOTE — Other (Signed)
TRANSFER - IN REPORT:    Verbal report received from OR  on Michael Galvan  being received from OR for routine post-op      Report consisted of patient's Situation, Background, Assessment and   Recommendations(SBAR).     Information from the following report(s) Nurse Handoff Report, Adult Overview, Surgery Report, Intake/Output, and MAR was reviewed with the receiving nurse.    Opportunity for questions and clarification was provided.      Assessment completed upon patient's arrival to unit and care assumed.

## 2022-05-30 NOTE — Anesthesia Post-Procedure Evaluation (Signed)
Department of Anesthesiology  Postprocedure Note    Patient: Michael Galvan  MRN: [id number]  Birthdate: May 10, 1950  Date of evaluation: 05/30/2022    Procedure Summary       Date: 05/30/22 Room / Location: [facility] MAIN 06 / [facility] MAIN OR    Anesthesia Start: 1319 Anesthesia Stop: 1308    Procedure: LEFT ENDOSCOPIC CARPAL TUNNEL RELEASE AND LEFT THIRD TRIGGER FINGER INJECTION (Left: Hand) Diagnosis:       Carpal tunnel syndrome on left      (Carpal tunnel syndrome on left [G56.02])    Surgeons: Onnie Boer, MD Responsible Provider: Myrna Blazer, MD    Anesthesia Type: General ASA Status: 3            Anesthesia Type: General    Aldrete Phase I: Aldrete Score: 10    Aldrete Phase II:      Anesthesia Post Evaluation    Patient location during evaluation: bedside  Patient participation: complete - patient participated  Level of consciousness: awake  Airway patency: patent  Nausea & Vomiting: no nausea and no vomiting  Cardiovascular status: hemodynamically stable  Respiratory status: acceptable  Hydration status: stable  Pain management: satisfactory to patient    There were no known notable events for this encounter.

## 2022-05-30 NOTE — Other (Signed)
TRANSFER - IN REPORT:    Verbal report received from [place] rn on Antino Vanzile  being received from pacu for routine post-op      Report consisted of patient's Situation, Background, Assessment and   Recommendations(SBAR).     Information from the following report(s) Nurse Handoff Report was reviewed with the receiving nurse.    Opportunity for questions and clarification was provided.      Assessment completed upon patient's arrival to unit and care assumed.

## 2022-05-30 NOTE — Progress Notes (Signed)
Family updated.

## 2022-05-30 NOTE — Op Note (Signed)
Single Incision Endoscopic Carpal Tunnel Release    Patient: Michael Galvan MRN: [id number]  CSN: [id number]   Date of Birth: 05/17/1950  Age: 72 y.o.  Sex: male      Date of Surgery:05/30/2022     PREOPERATIVE DIAGNOSES:  left Carpal Tunnel Syndrome, left 3rd trigger finger      POSTOPERATIVE DIAGNOSES:   left Carpal Tunnel Syndrome, left 3rd trigger finger      PROCEDURE: left Single Incision Endoscpoic Carpal Tunnel Release, left 3rd trigger finger cortisone injection.    IMPLANTS: * No implants in log *    SURGEON: Beryl Hornberger Ernestina Columbia, MD    FIRST ASSISTANT: Surgical Assistant: Velia Meyer Person First: Cleatis Polka  Scrub Person Second: Sherlie Ban  Physician Assistant: Salina April, Georgianne Fick, PA-C    SPECIMEN TO PATHOLOGY:  * No specimens in log *    ESTIMATED BLOOD LOSS: none    FINDINGS:  Same     TOURNIQUET  TIME:   Approximately 5 minutes at 993 Hg     COMPLICATIONS:  None     ANESTHESIA:  General    INDICATIONS:  A 72 y.o.  White (non-Hispanic) male  with known carpal tunnel syndrome documented by clinical exam and nerve conduction studies. The patient now presents for a ECTR.     PROCEDURE: The patient was brought into the operating theater and after adequate prepping and draping of the surgical wrist and hand the limb was then exsanguinated with an Esmarch bandage and the tourniquet was inflated.  A 1 cm transverse incision was placed at the volar flexion wrist crease just ulnar to the palmaris longus.  The incision was deepened to the antebrachial fascia which was released the length of the wound and proximally for an additional 1 cm.  Using the [employer] carpal tunnel release kit, the carpal tunnel was dilated in line with the fourth metacarpal to the distal extent of the transverse carpal ligament.  The cannula was then inserted and kept on some proximal radial deviation with the wrist in slight extension, and it was passed all the way to the cardinal line of Kaplan.  The scope was then inserted  with the wrist in slight extension and the undersurface of the transverse carpal ligament was seen easily from proximal to the distal fatty/adipose tissue at the end of the transverse carpal ligament.  Using the [facility] on the Shepherd set and using the endoscope for visualization, the transverse carpal ligament was transected in two passes with good release of the ligament outside of view of the cannula.  The nerve was never in harm's way.  The cannula was then withdrawn and placed back in the carpal tunnel with none of the prerelease tension.  The wound was then closed with Mastisol on either side and then Steri-Strips were used to re-oppose the skin without the use of stitches.  The skin and cut ends of the transverse carpal ligament were anesthetized with 4 to 5 mL of 0.25% Marcaine without epinephrine.  This was dressed with two 4 x 4s and an Ace wrap.  The tourniquet was deflated and the patient taken to the recovery room awake and in stable condition.  All instrument, sponge and needle counts were correct.  The left 3rd finger A-1 pulley was injected with 40mg  depomedrol.     Tommy Medal III, MD  05/30/2022

## 2022-05-30 NOTE — Discharge Instructions (Addendum)
DISCHARGE SUMMARY from Nurse    PATIENT INSTRUCTIONS:    After general anesthesia or intravenous sedation, for 24 hours or while taking prescription Narcotics:  Limit your activities  Do not drive and operate hazardous machinery  Do not make important personal or business decisions  Do  not drink alcoholic beverages  If you have not urinated within 8 hours after discharge, please contact your surgeon on call.    Report the following to your surgeon:  Excessive pain, swelling, redness or odor of or around the surgical area  Temperature over 100.5  Nausea and vomiting lasting longer than 4 hours or if unable to take medications  Any signs of decreased circulation or nerve impairment to extremity: change in color, persistent  numbness, tingling, coldness or increase pain  Any questions    What to do at Home:  Recommended activity: ,   REGULAR DIET   RETURN TO OFFICE AS SCHEDULED    If you experience any of the following symptoms heavy bleeding, fevers, severe pain, circulation changes, please follow up with dr Yolanda Bonine.    *  Please give a list of your current medications to your Primary Care Provider.    *  Please update this list whenever your medications are discontinued, doses are      changed, or new medications (including over-the-counter products) are added.    *  Please carry medication information at all times in case of emergency situations.    These are general instructions for a healthy lifestyle:    No smoking/ No tobacco products/ Avoid exposure to second hand smoke  Surgeon General's Warning:  Quitting smoking now greatly reduces serious risk to your health.    Obesity, smoking, and sedentary lifestyle greatly increases your risk for illness    A healthy diet, regular physical exercise & weight monitoring are important for maintaining a healthy lifestyle    You may be retaining fluid if you have a history of heart failure or if you experience any of the following symptoms:  Weight gain of 3 pounds or  more overnight or 5 pounds in a week, increased swelling in our hands or feet or shortness of breath while lying flat in bed.  Please call your doctor as soon as you notice any of these symptoms; do not wait until your next office visit.        The discharge information has been reviewed with the patient and spouse.  The patient and spouse verbalized understanding.  Discharge medications reviewed with the patient and spouse and appropriate educational materials and side effects teaching were provided.  ___________________________________________________________________________________________________________________________________    Tommy Medal, III, MD  Urbano Heir, PA-C    Upper Extremity Surgery  Discharge Instructions      Please take the time to review the following instructions before you leave the hospital and use them as guidelines during your recovery from surgery.  If you have any questions you may contact my office at [phone].    Wound Care/Dressing Changes:    [x]    You may remove the bulky dressing two days after surgery.  Once you remove this, no dressing is necessary if there is no drainage.      []    You may change your dressing as needed.  Beginning the 2 days after you are discharged from the hospital you should change your dressing daily.  A big, bulky dressing isn't necessary as long as there is any drainage from the incisions.  You can put  a band-aid or a piece of gauze over each incision and wear an ACE bandage as needed for comfort and swelling.    []    Don't remove your dressing or get them wet.     It isn't necessary to apply antibiotic ointment to your incisions.  Sutures will be removed at your one week post-op visit.  Staples (if you have them) are removed in two weeks.  If you have steri-strips over your incision they will start to peel off in 7-10 days as you get them wet.  They don't need to be removed prior to that.  When they begin to peel off, you may remove them.   They should all be removed by 14 days from your surgery.     Showering/Bathing:    [x]    You may shower 2 days after your surgery.  Your dressing may be removed for showering.  You may get your incisions wet in the shower.  Don't vigorously scrub your incisions.  Apply a clean, dry dressing after you have dried your incisions.  Do not take a bath or get into a swimming pool or Jacuzzi until the incisions are completely healed.  This may take about 14 days.  Do not soak your incision under water.    Sling:    [x]    You are not required to wear your sling and should do so only as needed for comfort. You have no restrictions with regards to the movement of your shoulder.  Please push to achieve full range of motion as soon as possible.  You may resume your normal daily activities immediately and return to work as soon as you feel appropriate.    []    You are not required to wear a sling.  You only need to wear this as needed for comfort.      []    Keep your arm in the immobilizer at all times except when showering, changing your clothes and doing the exercises shown to you by Dr. 9-10 or your physical therapist prior to your discharge from the hospital.  Keep your arm at your side when changing your clothes and showering.  Don't move it away from your body.    Ice/Elevation    Continue ice consistently for 48 hours after surgery.  After 48 hours, you should ice your surgical extremity 3 times per day, for 20 minutes at a time for the next 5 days.  After one week from surgery, you may use ice as needed for pain and swelling.    Diet:    You may advance to your regular diet as tolerated.      Medication:    You will be given a prescription for pain medication when you are discharged from the hospital.  Take the medication as needed according to the directions on the prescription bottle.  Possible side effects of the medication include dizziness, headache, nausea, vomiting, constipation and urinary retention.  If you  experience any of these side effects call the office so that we can assist you in relieving them.  Discontinue the use of the pain medication if you develop itching, rash, shortness of breath or difficulties swallowing.  If these symptoms become severe or aren't relieved by discontinuing the medication you should seek immediate medical attention.  Refills of pain medication are authorized during office hours only (8 AM-5PM Mon. thru Fri.).   If you were prescribed oxycodone or Dilaudid/hydromorphone you must have a written prescription.  These medications legally  cannot be called in to the pharmacy.  You may take over the counter Ibuprofen/Advil/Aleve between dosages of your pain medication if needed.  Do not take Tylenol in addition to your pain medication as most of the pain medication already contains Tylenol.  Do not exceed 3000mg  of Tylenol per day.  Ex: (hydrocodone 5/325g= 325mg  of Tylenol)  You may resume the medication you were taking prior to surgery.  Pain medication may change the effects of any antidepressant medication you are taking.  If you have any questions about possible interactions between your regular medications and the pain medication you should consult the physician who prescribes your regular medications.    Follow Up Appointment:  If you are unsure of your follow-up appointment date and time, please call [phone].  Please let our operator know you are scheduling a post-op appointment. Most appointments should be between 7-14 days after your surgery.    Physical Therapy:    []     If you already have a therapy appointment, please be sure to attend your sessions as scheduled.     []    Physical Therapy will be discussed with you at your first follow-up appointment with Dr. Yolanda Bonine.  You don't need to begin physical therapy prior to that.    []   Begin physical therapy with your Home Health Physical Therapy.  This will be set up for your before you leave the hospital.    [x]   You do not  require Physical Therapy.     Important Signs and Symptoms:    If any of the following signs and symptoms occurs, you should contact Dr. Yolanda Bonine office.  Please be advised if a problem arises which you feel requires immediate medical attention or you are unable to contact Dr. Yolanda Bonine office you should seek immediate medical attention.    Signs and symptoms to watch for include:    A sudden increase in swelling and/or redness or warmth at the area your surgery was performed which isn't relieved by rest, ice, and elevation.  Oral temperature greater than 101 degrees for 12 hours or more which isn't relieved by an increase in fluid intake and taking two Tylenol every 4-6 hours.   Excessive drainage from your incisions, or drainage which hasn't stopped by 72 hours after your surgery.  Fever, chills, shortness of breath, chest pain, nausea, vomiting or other signs and symptoms which are of concern to you.  Patient armband removed and shredded

## 2022-05-30 NOTE — Anesthesia Pre-Procedure Evaluation (Signed)
Department of Anesthesiology  Preprocedure Note       Name:  Michael Galvan   Age:  72 y.o.  DOB:  March 14, 1951                                          MRN:  [id number]         Date:  05/30/2022      Surgeon: Moishe Spice):  Luna Glasgow, MD    Procedure: Procedure(s):  LEFT ENDOSCOPIC CARPAL TUNNEL RELEASE AND LEFT THIRD TRIGGER FINGER INJECTION    Medications prior to admission:   Prior to Admission medications    Medication Sig Start Date End Date Taking? Authorizing Provider   ibuprofen (ADVIL;MOTRIN) 800 MG tablet Take 1 tablet by mouth every 6 hours as needed for Pain    [provider]   albuterol sulfate HFA (PROVENTIL;VENTOLIN;PROAIR) 108 (90 Base) MCG/ACT inhaler Inhale 1 puff into the lungs every 6 hours as needed 09/21/19   Automatic Reconciliation, Ar   atorvastatin (LIPITOR) 20 MG tablet Take 1 tablet by mouth nightly 02/25/20   Automatic Reconciliation, Ar   FLUoxetine (PROZAC) 10 MG capsule Take 1 capsule by mouth daily 02/25/20   Automatic Reconciliation, Ar   irbesartan-hydroCHLOROthiazide (AVALIDE) 150-12.5 MG per tablet Take 1 tablet by mouth daily 02/25/20   Automatic Reconciliation, Ar   metFORMIN (GLUCOPHAGE-XR) 500 MG extended release tablet Take 2 tablets by mouth daily 03/08/20   Automatic Reconciliation, Ar       Current medications:    Current Facility-Administered Medications   Medication Dose Route Frequency Provider Last Rate Last Admin   . sodium chloride flush 0.9 % injection 5-40 mL  5-40 mL IntraVENous 2 times per day Schwizer, Lenox Ponds, PA-C       . sodium chloride flush 0.9 % injection 5-40 mL  5-40 mL IntraVENous PRN Schwizer, Lenox Ponds, PA-C       . 0.9 % sodium chloride infusion   IntraVENous PRN Schwizer, Lenox Ponds, PA-C       . lactated ringers IV soln infusion   IntraVENous Continuous Luna Glasgow, MD 125 mL/hr at 05/30/22 1110 New Bag at 05/30/22 1110       Allergies:  No Known Allergies    Problem List:    Patient Active Problem List   Diagnosis  Code   . Pure hypercholesterolemia E78.00   . Elevated PSA R97.20   . Excessive daytime sleepiness G47.19   . Obstructive sleep apnea syndrome G47.33   . Type 2 diabetes mellitus without complication, without long-term current use of insulin (HCC) E11.9   . Anxiety F41.9   . Hypertension I10   . 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 Basal cell carcinoma     removal of BCC neck and left shoulder   . Diabetes mellitus (HCC)    . High cholesterol    . Hypertension    . Sleep apnea     Patient instructed to bring on DOS       Past Surgical History:        Procedure Laterality Date   . HERNIA REPAIR      umbilical x 2  1990's   . KNEE ARTHROSCOPY W/ ACL RECONSTRUCTION Right     x 2  2000's   . KNEE ARTHROSCOPY W/ ACL RECONSTRUCTION Left     2000's   . SHOULDER ARTHROSCOPY Left 04/18/2022    LEFT SHOULDER ARTHROSCOPIC DECOMPRESSION, RESECTION DISTAL CLAVICLE ROTATOR CUFF REPAIR, LONG HEAD BICEPS TENODESIS performed by Onnie Boer, MD at [facility] MAIN OR       Social History:    Social History     Tobacco Use   . Smoking status: Former     Current packs/day: 0.00     Types: Cigarettes     Quit date: 1979     Years since quitting: 45.0   . Smokeless tobacco: Never   Substance Use Topics   . Alcohol use: Yes     Comment: 2 glasses of wine or mixed drink monthly                                Counseling given: Not Answered      Vital Signs (Current):   Vitals:    05/30/22 1040   BP: (!) 153/80   Pulse: 75   Resp: 16   Temp: 96.9 F (36.1 C)   TempSrc: Temporal   SpO2: 99%   Weight: 117 kg (257 lb 14.4 oz)   Height: 1.88 m (6\' 2" )                                              BP Readings from Last 3 Encounters:   05/30/22 (!) 153/80   04/18/22 136/87       NPO Status:                                                                                 BMI:   Wt Readings from Last 3 Encounters:   05/30/22 117 kg (257 lb 14.4 oz)   05/27/22 116.6 kg (257 lb)   04/18/22  116.8 kg (257 lb 8 oz)     Body mass index is 33.11 kg/m.    CBC:   Lab Results   Component Value Date/Time    WBC 8.2 05/24/2022 12:56 PM    RBC 4.69 05/24/2022 12:56 PM    HGB 14.2 05/24/2022 12:56 PM    HCT 43.2 05/24/2022 12:56 PM    MCV 92.1 05/24/2022 12:56 PM    RDW 13.4 05/24/2022 12:56 PM    PLT 182 05/24/2022 12:56 PM       CMP:   Lab Results   Component Value Date/Time    NA 142 05/24/2022 12:56 PM    K 4.1 05/24/2022 12:56 PM    CL 109 05/24/2022 12:56 PM    CO2 28 05/24/2022 12:56 PM    BUN 15 05/24/2022 12:56 PM    CREATININE 0.85 05/24/2022 12:56 PM    AGRATIO 1.1 05/24/2022 12:56 PM    LABGLOM >60 05/24/2022 12:56 PM    GLUCOSE 106 05/24/2022 12:56 PM    PROT 7.4 05/24/2022 12:56 PM    CALCIUM 9.1 05/24/2022 12:56 PM    BILITOT 0.5 05/24/2022  12:56 PM    ALKPHOS 79 05/24/2022 12:56 PM    AST 28 05/24/2022 12:56 PM    ALT 44 05/24/2022 12:56 PM       POC Tests:   Recent Labs     05/30/22  1107   POCGLU 127*       Coags: No results found for: "PROTIME", "INR", "APTT"    HCG (If Applicable): No results found for: "PREGTESTUR", "PREGSERUM", "HCG", "HCGQUANT"     ABGs: No results found for: "PHART", "PO2ART", "PCO2ART", "HCO3ART", "BEART", "O2SATART"     Type & Screen (If Applicable):  No results found for: "LABABO", "LABRH"    Drug/Infectious Status (If Applicable):  No results found for: "HIV", "HEPCAB"    COVID-19 Screening (If Applicable): No results found for: "COVID19"        Anesthesia Evaluation  Patient summary reviewed and Nursing notes reviewed  Airway: Mallampati: II  TM distance: >3 FB   Neck ROM: full  Mouth opening: > = 3 FB   Dental:    (+) caps      Pulmonary:normal exam    (+)     sleep apnea: on CPAP,                                  Cardiovascular:    (+) hypertension: no interval change, hyperlipidemia        Rhythm: regular  Rate: normal                    Neuro/Psych:   (+) depression/anxiety             GI/Hepatic/Renal: Neg GI/Hepatic/Renal ROS            Endo/Other:    (+)  Diabetes.                 Abdominal:             Vascular:          Other Findings:       Anesthesia Plan      general     ASA 3       Induction: intravenous.      Anesthetic plan and risks discussed with patient.      Plan discussed with CRNA.    Attending anesthesiologist reviewed and agrees with Preprocedure content            Myrna Blazer, MD   05/30/2022

## 2022-05-30 NOTE — Other (Signed)
Reviewed PTA medication list with patient/caregiver and patient/caregiver denies any additional medications.     Patient admits to having a responsible adult care for them at home for at least 24 hours after surgery.    Patient encouraged to use gown warming system and informed that using said warming gown to regulate body temperature prior to a procedure has been shown to help reduce the risks of blood clots and infection.    Patient's pharmacy of choice verified and documented in PTA medication section.    Dual skin assessment & fall risk band verification completed with Faith A. RN.

## 2022-05-30 NOTE — Interval H&P Note (Signed)
Update History & Physical    The patient's History and Physical of January 23, THE SURGERY was reviewed with the patient and I examined the patient. There was no change. The surgical site was confirmed by the patient and me.     Plan: The risks, benefits, expected outcome, and alternative to the recommended procedure have been discussed with the patient. Patient understands and wants to proceed with the procedure.     Electronically signed by Onnie Boer, MD on 05/30/2022 at 12:44 PM

## 2022-05-30 NOTE — Other (Signed)
TRANSFER - OUT REPORT:    Verbal report given to Lattie Haw RN  on Michael Galvan  being transferred to phase 2  for routine post-op       Report consisted of patient's Situation, Background, Assessment and   Recommendations(SBAR).     Information from the following report(s) Nurse Handoff Report, Adult Overview, Surgery Report, Intake/Output, MAR, and Recent Results was reviewed with the receiving nurse.           Lines:   Peripheral IV 05/30/22 Posterior;Right Hand (Active)   Site Assessment Clean, dry & intact 05/30/22 1411   Line Status Infusing 05/30/22 1411   Phlebitis Assessment No symptoms 05/30/22 1411   Infiltration Assessment 0 05/30/22 1411   Dressing Status Clean, dry & intact 05/30/22 1411   Dressing Type Transparent 05/30/22 1411   Dressing Intervention New 05/30/22 1114        Opportunity for questions and clarification was provided.      Patient transported with:  Registered Nurse

## 2022-12-01 IMAGING — DX DG CHEST 2V
2 series · 2 of 2 positions shown · non-contrast
Comparison: None.

CLINICAL DATA: Shortness of breath with cough.  Recent travel.

EXAM:
CHEST - 2 VIEW

[chest lat]
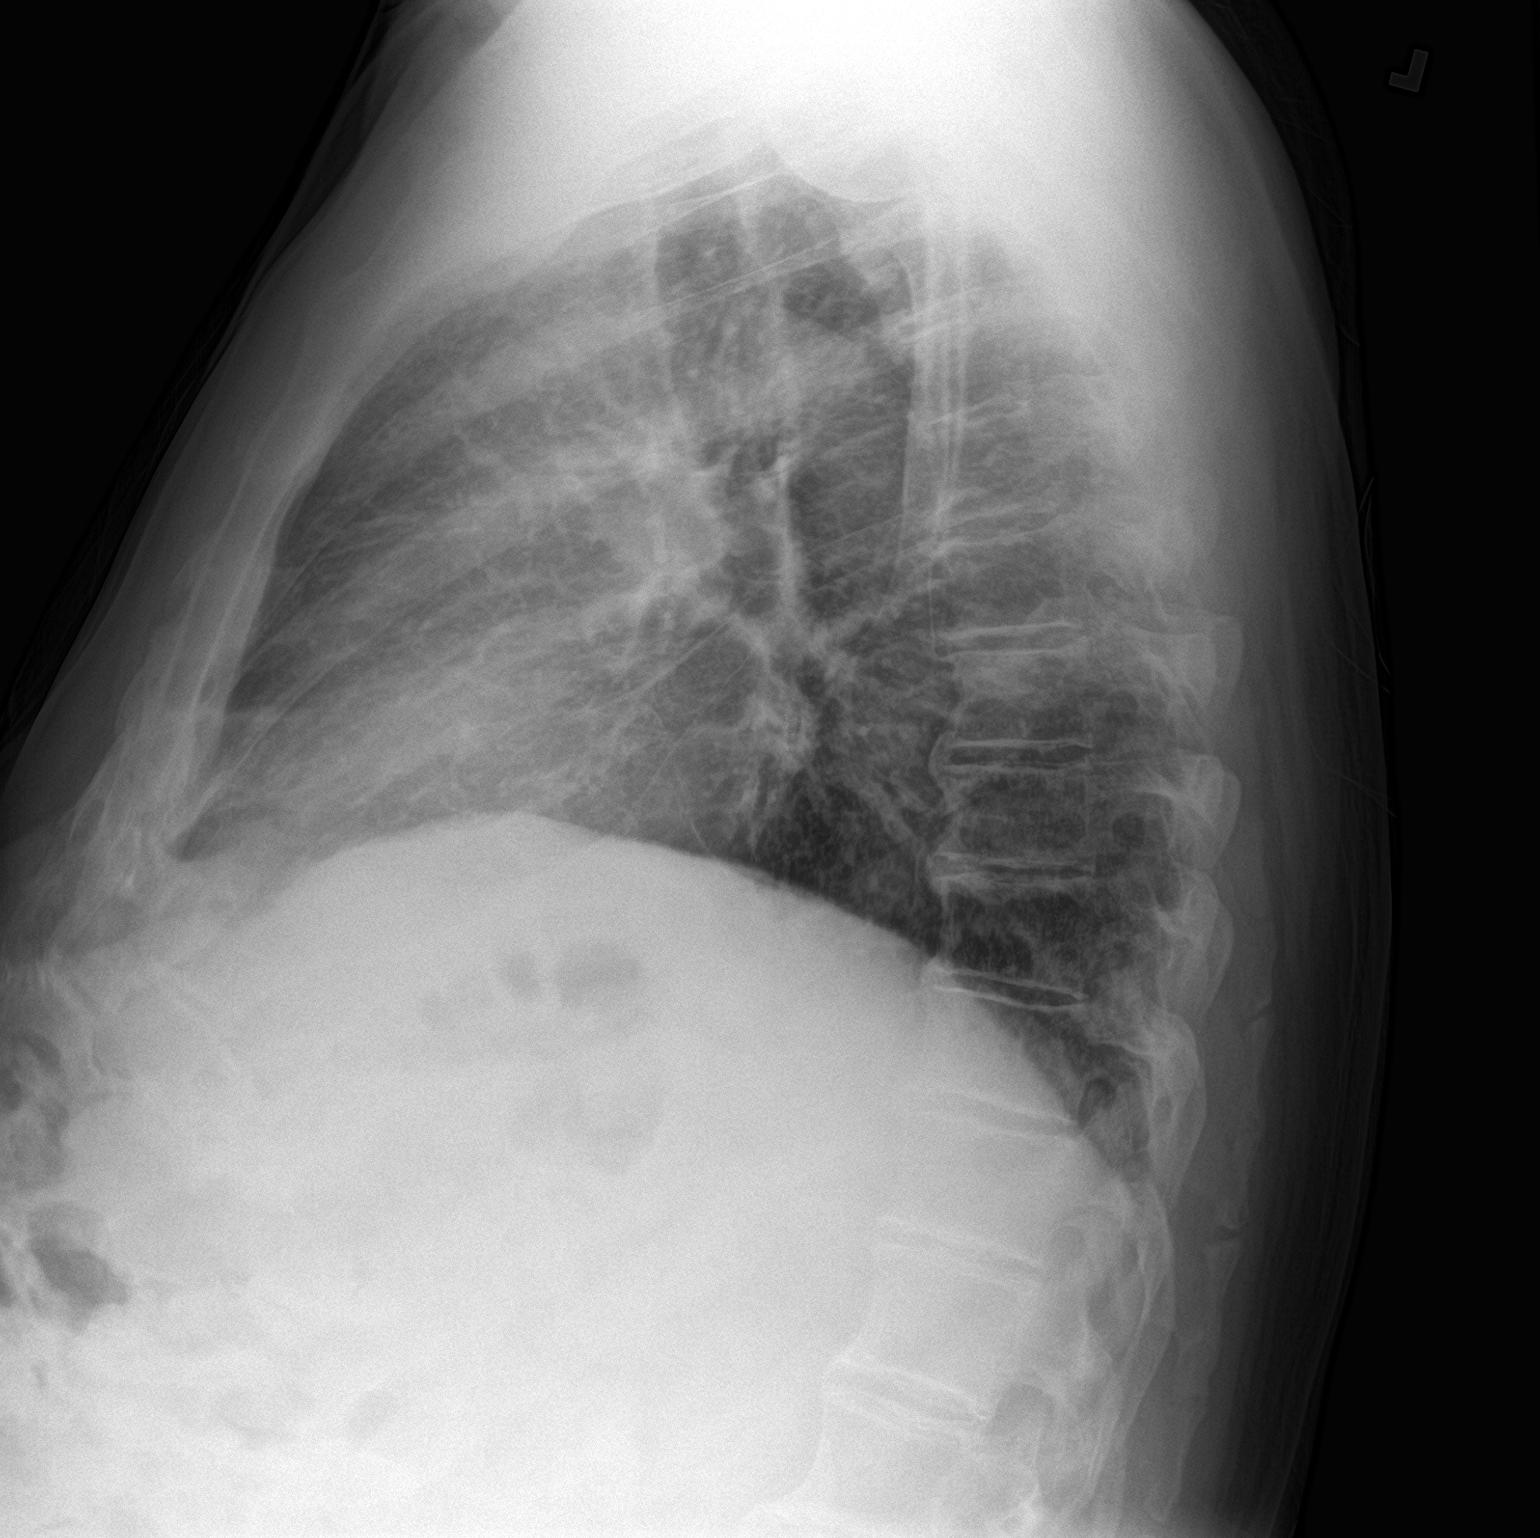

[chest pa]
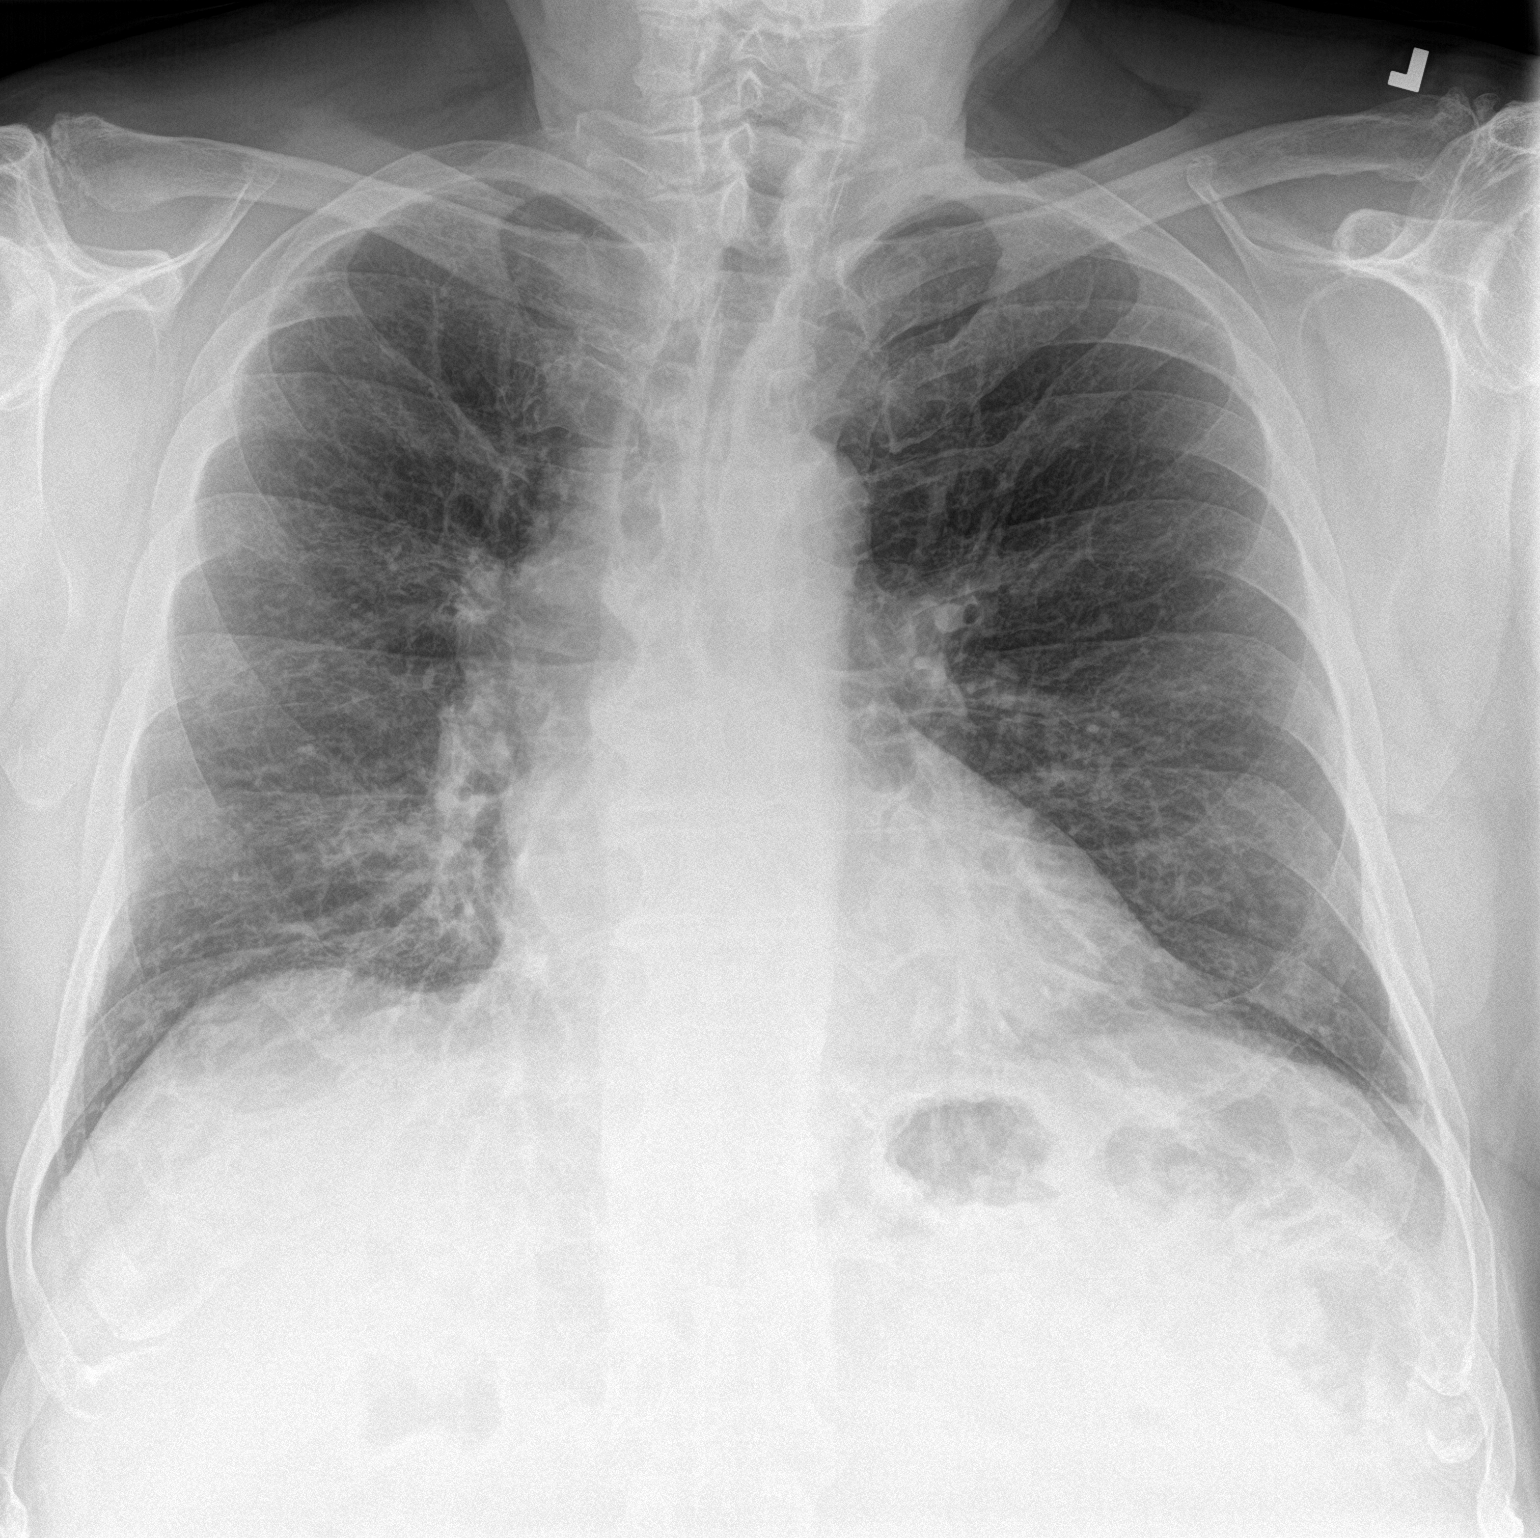

[2 of 2 positions shown; findings below may reference images not displayed]

FINDINGS: Midline trachea. Normal heart size and mediastinal contours. No
pleural effusion or pneumothorax. Clear lungs. Mild convex right
thoracolumbar spine curvature.
IMPRESSION: No active cardiopulmonary disease.

## 2023-04-03 LAB — HEMOGLOBIN A1C
Estimated Avg Glucose, External: 161 mg/dL — ABNORMAL HIGH (ref 91–123)
Hemoglobin A1C, External: 7.2 % — ABNORMAL HIGH (ref 4.8–5.6)

## 2023-09-26 LAB — HEMOGLOBIN A1C
Estimated Avg Glucose, External: 148 mg/dL — ABNORMAL HIGH (ref 91–123)
Hemoglobin A1C, External: 6.8 % — ABNORMAL HIGH (ref 4.8–5.6)

## 2024-03-19 ENCOUNTER — Encounter: Primary: Internal Medicine

## 2024-03-19 ENCOUNTER — Encounter

## 2024-03-19 ENCOUNTER — Inpatient Hospital Stay: Admit: 2024-03-19 | Payer: MEDICARE | Primary: Internal Medicine

## 2024-03-19 DIAGNOSIS — M1711 Unilateral primary osteoarthritis, right knee: Secondary | ICD-10-CM

## 2024-03-19 LAB — COMPREHENSIVE METABOLIC PANEL
ALT: 35 U/L (ref 10–50)
AST: 33 U/L (ref 10–38)
Albumin/Globulin Ratio: 1.1 (ref 0.8–1.7)
Albumin: 3.8 g/dL (ref 3.4–5.0)
Alk Phosphatase: 90 U/L (ref 45–117)
Anion Gap: 11 mmol/L (ref 3–18)
BUN/Creatinine Ratio: 14 (ref 12–20)
BUN: 11 mg/dL (ref 6–23)
CO2: 27 mmol/L (ref 21–32)
Calcium: 9.7 mg/dL (ref 8.6–10.2)
Chloride: 101 mmol/L (ref 98–107)
Creatinine: 0.75 mg/dL (ref 0.60–1.30)
Est, Glom Filt Rate: >90 ml/min/1.73m2 (ref >60)
Globulin: 3.3 g/dL (ref 2.0–4.0)
Glucose: 94 mg/dL (ref 74–108)
Potassium: 4.2 mmol/L (ref 3.5–5.5)
Sodium: 138 mmol/L (ref 136–145)
Total Bilirubin: 0.6 mg/dL (ref 0.2–1.0)
Total Protein: 7.1 g/dL (ref 6.4–8.2)

## 2024-03-19 LAB — CBC
Hematocrit: 41.9 % (ref 36.0–48.0)
Hemoglobin: 13.9 g/dL (ref 13.0–16.0)
MCH: 30.1 pg (ref 24.0–34.0)
MCHC: 33.2 g/dL (ref 31.0–37.0)
MCV: 90.7 FL (ref 78.0–100.0)
MPV: 11.5 FL (ref 9.2–11.8)
Nucleated RBCs: 0 /100{WBCs}
Platelets: 183 K/uL (ref 135–420)
RBC: 4.62 M/uL (ref 4.35–5.65)
RDW: 13.4 % (ref 11.6–14.5)
WBC: 9 K/uL (ref 4.6–13.2)
nRBC: 0 K/uL (ref 0.00–0.01)

## 2024-03-19 LAB — EKG 12-LEAD
Atrial Rate: 64 {beats}/min
Diagnosis: NORMAL
P Axis: 48 degrees
P-R Interval: 174 ms
Q-T Interval: 422 ms
QRS Duration: 90 ms
QTc Calculation (Bazett): 435 ms
R Axis: 15 degrees
T Axis: 32 degrees
Ventricular Rate: 64 {beats}/min

## 2024-03-21 LAB — CULTURE, MRSA, SCREENING

## 2024-03-22 LAB — FAX TO: Fax to Number: 7575918560
